# Patient Record
Sex: Female | Born: 1985
Health system: Southern US, Community
[De-identification: ages and names within clinical notes are randomized; demographics above are authoritative.]

## PROBLEM LIST (undated history)

## (undated) DIAGNOSIS — F32A Depression, unspecified: Secondary | ICD-10-CM

## (undated) DIAGNOSIS — F419 Anxiety disorder, unspecified: Secondary | ICD-10-CM

## (undated) DIAGNOSIS — F329 Major depressive disorder, single episode, unspecified: Secondary | ICD-10-CM

## (undated) HISTORY — DX: Anxiety disorder, unspecified: F41.9

## (undated) HISTORY — DX: Depression, unspecified: F32.A

## (undated) HISTORY — PX: BREAST SURGERY: SHX581

## (undated) HISTORY — DX: Major depressive disorder, single episode, unspecified: F32.9

---
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
## 2016-08-21 HISTORY — PX: BREAST ENHANCEMENT SURGERY: SHX7

## 2017-10-30 ENCOUNTER — Telehealth: Payer: Self-pay | Admitting: Nurse Practitioner

## 2017-10-30 DIAGNOSIS — J111 Influenza due to unidentified influenza virus with other respiratory manifestations: Secondary | ICD-10-CM

## 2017-10-30 DIAGNOSIS — R69 Illness, unspecified: Secondary | ICD-10-CM

## 2017-10-30 MED ORDER — OSELTAMIVIR PHOSPHATE 75 MG PO CAPS: 75.0000 mg | ORAL_CAPSULE | Freq: Two times a day (BID) | ORAL | 0 refills | Status: DC

## 2017-10-30 NOTE — Progress Notes (Signed)

## 2017-10-31 MED FILL — OSELTAMIVIR PHOSPHATE 75 MG: 75 | 5 days supply | Qty: 10 | Fill #0

## 2017-11-04 NOTE — Progress Notes (Addendum)
[facility] at [facility], [place], [place] [address] [phone]  Date:  11/05/2017   Name:  Debbie Harris   DOB:  06-11-1986   MRN:  [id number]  PCP:  Darreld Mclean, MD    Chief Complaint: Establish Care (Pt here for est care. )   History of Present Illness:  Debbie Harris is a 32 y.o. very pleasant female patient who presents with the following:  New patient here today to establish care- however she is also still sick from a recent illness She also just had the flu last week, did an evisit for this and got tamiflu She does not feel like she is a lot better.   She has been exposed to illness at her job.  Has not great felt for about a month She did have a fever, up to about 100 degrees She is still coughing a lot and bringing up mucus, dark green in color She continues to have sinus pressure and pain. 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 They plan to start a family soon but she is not yet pregnant  She has a horseshoe kidney and had a ureter repair at age 29 for frequent UTI She does tend to get UTI still, but has learned how to help prevent these  In her free time she enjoys spending time with her dog and cat, cooking, hiking, exercise  Her LMP is current  There are no active problems to display for this patient.   Past Medical History:  Diagnosis Date  . Depression     Past Surgical History:  Procedure Laterality Date  . BREAST ENHANCEMENT SURGERY Bilateral 2018    Social History   Tobacco Use  . Smoking status: Never Smoker  . Smokeless tobacco: Never Used  Substance Use Topics  . Alcohol use: Yes    Comment: social  . Drug use: No     Family History  Problem Relation Age of Onset  . Hypertension Father   . Hyperlipidemia Father   . Breast cancer Maternal Grandmother     Allergies  Allergen Reactions  . Sulfa Antibiotics     Medication list has been reviewed and updated.  Current Outpatient Medications on File Prior to Visit  Medication Sig Dispense Refill  . Multiple Vitamin (MULTIVITAMIN) tablet Take 2 tablets by mouth daily.     No current facility-administered medications on file prior to visit.     Review of Systems:  As per HPI- otherwise negative. No current fever She is not feeling great today but is non- toxic  Physical Examination: Vitals:   11/05/17 1028  BP: 110/82  Pulse: 92  Temp: 98.6 F (37 C)  SpO2: 98%   Vitals:   11/05/17 1028  Weight: 121 lb 12.8 oz (55.2 kg)  Height: 5' 9.75" (1.772 m)   Body mass index is 17.6 kg/m. Ideal Body Weight: Weight in (lb) to have BMI = 25: 172.6  GEN: WDWN, NAD, Non-toxic, A & O x 3, thin build, otherwise looks well  HEENT: Atraumatic, Normocephalic. Neck supple. No masses, No LAD. Ears and Nose: No external deformity. CV:  RRR, No M/G/R. No JVD. No thrill. No extra heart sounds. PULM: CTA B, no wheezes, crackles, rhonchi. No retractions. No resp. distress. No accessory muscle use. ABD: S, NT, ND, +BS. No rebound. No HSM. EXTR: No c/c/e NEURO Normal gait.  PSYCH: Normally interactive. Conversant. Not depressed or anxious appearing.  Calm demeanor.   Dg Chest 2 View  Result Date: 11/05/2017 CLINICAL DATA:  Cough and fever EXAM: CHEST - 2 VIEW COMPARISON:  None. FINDINGS: Lungs are clear. Heart size and pulmonary vascularity are normal. No adenopathy. No bone lesions. IMPRESSION: No edema or consolidation. Electronically Signed   By: Lowella Grip III M.D.   On: 11/05/2017 11:20     Assessment and Plan: Cough - Plan: cefdinir (OMNICEF) 250 MG/5ML suspension, DG Chest 2 View  Low grade fever - Plan: CBC, Comprehensive metabolic  panel, DG Chest 2 View  Screening for hyperlipidemia - Plan: Lipid panel  Screening for diabetes mellitus - Plan: Hemoglobin A1c   Here today to establish care, but also suffering from prolonged sx after recent presumed flu Will obtain CXR today Plan to treat with omnicef for bronchitis/ sinusitis sx Labs pending as well Signed Lamar Blinks, MD Received films, message to pt   Received her labs- message to pt  Results for orders placed or performed in visit on 11/05/17  CBC  Result Value Ref Range   WBC 8.8 4.0 - 10.5 K/uL   RBC 4.67 3.87 - 5.11 Mil/uL   Platelets 316.0 150.0 - 400.0 K/uL   Hemoglobin 14.8 12.0 - 15.0 g/dL   HCT 43.6 36.0 - 46.0 %   MCV 93.4 78.0 - 100.0 fl   MCHC 34.0 30.0 - 36.0 g/dL   RDW 12.6 11.5 - 15.5 %  Comprehensive metabolic panel  Result Value Ref Range   Sodium 140 135 - 145 mEq/L   Potassium 3.8 3.5 - 5.1 mEq/L   Chloride 102 96 - 112 mEq/L   CO2 28 19 - 32 mEq/L   Glucose, Bld 90 70 - 99 mg/dL   BUN 14 6 - 23 mg/dL   Creatinine, Ser 0.80 0.40 - 1.20 mg/dL   Total Bilirubin 0.5 0.2 - 1.2 mg/dL   Alkaline Phosphatase 58 39 - 117 U/L   AST 19 0 - 37 U/L   ALT 20 0 - 35 U/L   Total Protein 8.2 6.0 - 8.3 g/dL   Albumin 4.5 3.5 - 5.2 g/dL   Calcium 10.1 8.4 - 10.5 mg/dL   GFR 87.67 >60.00 mL/min  Lipid panel  Result Value Ref Range   Cholesterol 148 0 - 200 mg/dL   Triglycerides 94.0 0.0 - 149.0 mg/dL   HDL 56.00 >39.00 mg/dL   VLDL 18.8 0.0 - 40.0 mg/dL   LDL Cholesterol 73 0 - 99 mg/dL   Total CHOL/HDL Ratio 3    NonHDL 91.98   Hemoglobin A1c  Result Value Ref Range   Hgb A1c MFr Bld 5.1 4.6 - 6.5 %

## 2017-11-05 ENCOUNTER — Encounter: Payer: Self-pay | Admitting: Family Medicine

## 2017-11-05 ENCOUNTER — Ambulatory Visit (HOSPITAL_BASED_OUTPATIENT_CLINIC_OR_DEPARTMENT_OTHER)
Admission: RE | Admit: 2017-11-05 | Discharge: 2017-11-05 | Disposition: A | Discharge status: Home / Self Care | Payer: 59 | Source: Ambulatory Visit | Attending: Family Medicine | Admitting: Family Medicine

## 2017-11-05 ENCOUNTER — Ambulatory Visit (INDEPENDENT_AMBULATORY_CARE_PROVIDER_SITE_OTHER): Payer: 59 | Admitting: Family Medicine

## 2017-11-05 VITALS — BP 110/82 | HR 92 | Temp 98.6°F | Ht 69.75 in | Wt 121.8 lb

## 2017-11-05 DIAGNOSIS — R05 Cough: Secondary | ICD-10-CM

## 2017-11-05 DIAGNOSIS — Z131 Encounter for screening for diabetes mellitus: Secondary | ICD-10-CM

## 2017-11-05 DIAGNOSIS — Z13220 Encounter for screening for lipoid disorders: Secondary | ICD-10-CM | POA: Diagnosis not present

## 2017-11-05 DIAGNOSIS — R509 Fever, unspecified: Secondary | ICD-10-CM

## 2017-11-05 DIAGNOSIS — R059 Cough, unspecified: Secondary | ICD-10-CM

## 2017-11-05 LAB — COMPREHENSIVE METABOLIC PANEL
ALK PHOS: 58 U/L (ref 39–117)
ALT: 20 U/L (ref 0–35)
AST: 19 U/L (ref 0–37)
Albumin: 4.5 g/dL (ref 3.5–5.2)
BUN: 14 mg/dL (ref 6–23)
CHLORIDE: 102 meq/L (ref 96–112)
CO2: 28 meq/L (ref 19–32)
Calcium: 10.1 mg/dL (ref 8.4–10.5)
Creatinine, Ser: 0.8 mg/dL (ref 0.40–1.20)
GFR: 87.67 mL/min (ref >60.00)
GLUCOSE: 90 mg/dL (ref 70–99)
POTASSIUM: 3.8 meq/L (ref 3.5–5.1)
Sodium: 140 mEq/L (ref 135–145)
Total Bilirubin: 0.5 mg/dL (ref 0.2–1.2)
Total Protein: 8.2 g/dL (ref 6.0–8.3)

## 2017-11-05 LAB — CBC
HEMATOCRIT: 43.6 % (ref 36.0–46.0)
HEMOGLOBIN: 14.8 g/dL (ref 12.0–15.0)
MCHC: 34 g/dL (ref 30.0–36.0)
MCV: 93.4 fl (ref 78.0–100.0)
Platelets: 316 10*3/uL (ref 150.0–400.0)
RBC: 4.67 Mil/uL (ref 3.87–5.11)
RDW: 12.6 % (ref 11.5–15.5)
WBC: 8.8 10*3/uL (ref 4.0–10.5)

## 2017-11-05 LAB — LIPID PANEL
CHOL/HDL RATIO: 3
Cholesterol: 148 mg/dL (ref 0–200)
HDL: 56 mg/dL (ref >39.00)
LDL CALC: 73 mg/dL (ref 0–99)
NONHDL: 91.98
Triglycerides: 94 mg/dL (ref 0.0–149.0)
VLDL: 18.8 mg/dL (ref 0.0–40.0)

## 2017-11-05 LAB — HEMOGLOBIN A1C: HEMOGLOBIN A1C: 5.1 % (ref 4.6–6.5)

## 2017-11-05 MED ORDER — CEFDINIR 250 MG/5ML PO SUSR: ORAL | 0 refills | Status: DC

## 2017-11-05 MED FILL — CEFDINIR 250 MG/5 ML SUSP: 250 | 7 days supply | Qty: 100 | Fill #0

## 2017-11-05 NOTE — Patient Instructions (Signed)
It was nice to meet you today!  I will check some labs for you, and a chest x-ray today We sent an antibiotic to the pharmacy here for you to use for bronchitis/ sinus infection, and will look for any pneumonia on your x-ray today I'll be in touch with your results asap

## 2017-12-22 NOTE — Progress Notes (Signed)
[place] at [facility] [address], [place], [place] [address] [phone]  Date:  12/24/2017   Name:  Debbie Harris   DOB:  06-04-1986   MRN:  [id number]  PCP:  Darreld Mclean, MD    Chief Complaint: Annual Exam (Pt is here for CPE.)   History of Present Illness:  Debbie Harris is a 32 y.o. 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 They plan to start a family soon but she is not yet pregnant She has a horseshoe kidney and had a ureter repair at age 18 for frequent UTI She does tend to get UTI still, but has learned how to help prevent these In her free time she enjoys spending time with her dog and cat, cooking, hiking, exercise  We did labs for her in March which were normal Pap: was done in [place]- she thinks perhaps 2 years ago She did have an abnormal pap and colpo; this was about 3 -4years ago . Her next pap was normal   Started menses today - we discussed doing her pap but she would like to do at a later date  She and her fiance bought a house just yesterday.  The wedding is coming up soon!  She notes that she was getting more anxious and stressed during the time that they were buying their home  She had been off her lexapro, but she decided to go back on.  She went back on this just about 3 weeks ago- this seems to be doing ok.  I will refill for her today Depression is not really an issue for her- more so anxiety She also had some clonopin on hand in the past for occasional use and would like to have some of this for panic attacks- she is not really sure of the dose she was on [place]- no entries, I don't have access to [place] database No SI   She did  have breast implants placed last August- not in [place]- and would like a breast exam today.  She notes that her implants seem low but was told that they would "drop into position" with time    She has noted more difficulty with her computer screen at work recently- she is not sure if she might need an eye exam  There are no active problems to display for this patient.   Past Medical History:  Diagnosis Date  . Depression     Past Surgical History:  Procedure Laterality Date  . BREAST ENHANCEMENT SURGERY Bilateral 2018    Social History   Tobacco Use  . Smoking status: Never Smoker  . Smokeless tobacco: Never Used  Substance Use Topics  . Alcohol use: Yes    Comment: social  . Drug use: No    Family History  Problem Relation Age of Onset  . Hypertension Father   . Hyperlipidemia Father   . Breast cancer Maternal Grandmother     Allergies  Allergen Reactions  . Sulfa Antibiotics     Medication list has been reviewed and updated.  Current Outpatient Medications on File Prior to Visit  Medication Sig  Dispense Refill  . Multiple Vitamin (MULTIVITAMIN) tablet Take 2 tablets by mouth daily.     No current facility-administered medications on file prior to visit.     Review of Systems:  As per HPI- otherwise negative.   Physical Examination: Vitals:   12/24/17 0901  BP: 103/70  Pulse: 69  Temp: 98.3 F (36.8 C)   Vitals:   12/24/17 0901  Weight: 120 lb 9.6 oz (54.7 kg)  Height: 5\' 9"  (1.753 m)   Body mass index is 17.81 kg/m. Ideal Body Weight: Weight in (lb) to have BMI = 25: 168.9  GEN: WDWN, NAD, Non-toxic, A & O x 3, thin build, looks well  HEENT: Atraumatic, Normocephalic. Neck supple. No masses, No LAD. Bilateral TM wnl, oropharynx normal.  PEERL,EOMI.   Limited fundoscopic exam wnl today  Ears and Nose: No external deformity. CV: RRR, No M/G/R. No JVD. No thrill. No extra heart sounds. PULM: CTA B, no wheezes, crackles, rhonchi. No retractions. No  resp. distress. No accessory muscle use. ABD: S, NT, ND, +BS. No rebound. No HSM. EXTR: No c/c/e NEURO Normal gait.  PSYCH: Normally interactive. Conversant. Not depressed or anxious appearing.  Calm demeanor.  Breast exam:  Certainly no masses felt today.   However it does appear to me that her implants are low- I am not sure if this is going to be their final position as I am not a breast surgeon She has several nevi on her back- none appear to be cancerous but one is larger than a pencil eraser    Assessment and Plan: Physical exam  Situational anxiety - Plan: escitalopram (LEXAPRO) 10 MG tablet, clonazePAM (KLONOPIN) 0.5 MG tablet  Multiple pigmented nevi - Plan: Ambulatory referral to Dermatology  CPE today- she will come in for a pap at a later date as she is menstruating today Referral to derm for a skin check Refilled her lexapro rx for clonopin 0.5 for occasional use Plan for a pap later Counseled her that her breasts do not show any masses and medically I don't have any concerns.  However I really can't comment on the placement of her implants.  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

## 2017-12-24 ENCOUNTER — Encounter: Payer: Self-pay | Admitting: Family Medicine

## 2017-12-24 ENCOUNTER — Ambulatory Visit (INDEPENDENT_AMBULATORY_CARE_PROVIDER_SITE_OTHER): Payer: 59 | Admitting: Family Medicine

## 2017-12-24 VITALS — BP 103/70 | HR 69 | Temp 98.3°F | Ht 69.0 in | Wt 120.6 lb

## 2017-12-24 DIAGNOSIS — Z0001 Encounter for general adult medical examination with abnormal findings: Secondary | ICD-10-CM

## 2017-12-24 DIAGNOSIS — D229 Melanocytic nevi, unspecified: Secondary | ICD-10-CM

## 2017-12-24 DIAGNOSIS — F418 Other specified anxiety disorders: Secondary | ICD-10-CM | POA: Diagnosis not present

## 2017-12-24 DIAGNOSIS — Z0000 Encounter for general adult medical examination without abnormal findings: Secondary | ICD-10-CM

## 2017-12-24 MED ORDER — CLONAZEPAM 0.5 MG PO TABS: 0.5000 mg | ORAL_TABLET | Freq: Two times a day (BID) | ORAL | 1 refills | Status: DC | PRN

## 2017-12-24 MED ORDER — ESCITALOPRAM OXALATE 10 MG PO TABS: 10.0000 mg | ORAL_TABLET | Freq: Every day | ORAL | 3 refills | Status: DC

## 2017-12-24 MED FILL — ESCITALOPRAM 10 MG TABLET: 10 | 90 days supply | Qty: 90 | Fill #0

## 2017-12-24 MED FILL — clonazePAM 0.5 MG TABS: 0.5 | 10 days supply | Qty: 20 | Fill #0

## 2017-12-24 NOTE — Patient Instructions (Addendum)
Good to see you today- we can do your pap at your convenience I will set you up to see dermatology for a skin check Please do see an optometrist for an eye exam We refilled your lexapro today I also did give you some klonopin to use on occasion as needed for anxiety - remember this can be habit forming and can make you feel tired. Use this as little as you can   Health Maintenance, Female Adopting a healthy lifestyle and getting preventive care can go a long way to promote health and wellness. Talk with your health care provider about what schedule of regular examinations is right for you. This is a good chance for you to check in with your provider about disease prevention and staying healthy. In between checkups, there are plenty of things you can do on your own. Experts have done a lot of research about which lifestyle changes and preventive measures are most likely to keep you healthy. Ask your health care provider for more information. Weight and diet Eat a healthy diet  Be sure to include plenty of vegetables, fruits, low-fat dairy products, and lean protein.  Do not eat a lot of foods high in solid fats, added sugars, or salt.  Get regular exercise. This is one of the most important things you can do for your health. ? Most adults should exercise for at least 150 minutes each week. The exercise should increase your heart rate and make you sweat (moderate-intensity exercise). ? Most adults should also do strengthening exercises at least twice a week. This is in addition to the moderate-intensity exercise.  Maintain a healthy weight  Body mass index (BMI) is a measurement that can be used to identify possible weight problems. It estimates body fat based on height and weight. Your health care provider can help determine your BMI and help you achieve or maintain a healthy weight.  For females 73 years of age and older: ? A BMI below 18.5 is considered underweight. ? A BMI of 18.5 to 24.9  is normal. ? A BMI of 25 to 29.9 is considered overweight. ? A BMI of 30 and above is considered obese.  Watch levels of cholesterol and blood lipids  You should start having your blood tested for lipids and cholesterol at 34 years of age, then have this test every 5 years.  You may need to have your cholesterol levels checked more often if: ? Your lipid or cholesterol levels are high. ? You are older than 34 years of age. ? You are at high risk for heart disease.  Cancer screening Lung Cancer  Lung cancer screening is recommended for adults 46-14 years old who are at high risk for lung cancer because of a history of smoking.  A yearly low-dose CT scan of the lungs is recommended for people who: ? Currently smoke. ? Have quit within the past 15 years. ? Have at least a 30-pack-year history of smoking. A pack year is smoking an average of one pack of cigarettes a day for 1 year.  Yearly screening should continue until it has been 15 years since you quit.  Yearly screening should stop if you develop a health problem that would prevent you from having lung cancer treatment.  Breast Cancer  Practice breast self-awareness. This means understanding how your breasts normally appear and feel.  It also means doing regular breast self-exams. Let your health care provider know about any changes, no matter how small.  If you  are in your 20s or 30s, you should have a clinical breast exam (CBE) by a health care provider every 1-3 years as part of a regular health exam.  If you are 35 or older, have a CBE every year. Also consider having a breast X-ray (mammogram) every year.  If you have a family history of breast cancer, talk to your health care provider about genetic screening.  If you are at high risk for breast cancer, talk to your health care provider about having an MRI and a mammogram every year.  Breast cancer gene (BRCA) assessment is recommended for women who have family members  with BRCA-related cancers. BRCA-related cancers include: ? Breast. ? Ovarian. ? Tubal. ? Peritoneal cancers.  Results of the assessment will determine the need for genetic counseling and BRCA1 and BRCA2 testing.  Cervical Cancer Your health care provider may recommend that you be screened regularly for cancer of the pelvic organs (ovaries, uterus, and vagina). This screening involves a pelvic examination, including checking for microscopic changes to the surface of your cervix (Pap test). You may be encouraged to have this screening done every 3 years, beginning at age 53.  For women ages 25-65, health care providers may recommend pelvic exams and Pap testing every 3 years, or they may recommend the Pap and pelvic exam, combined with testing for human papilloma virus (HPV), every 5 years. Some types of HPV increase your risk of cervical cancer. Testing for HPV may also be done on women of any age with unclear Pap test results.  Other health care providers may not recommend any screening for nonpregnant women who are considered low risk for pelvic cancer and who do not have symptoms. Ask your health care provider if a screening pelvic exam is right for you.  If you have had past treatment for cervical cancer or a condition that could lead to cancer, you need Pap tests and screening for cancer for at least 20 years after your treatment. If Pap tests have been discontinued, your risk factors (such as having a new sexual partner) need to be reassessed to determine if screening should resume. Some women have medical problems that increase the chance of getting cervical cancer. In these cases, your health care provider may recommend more frequent screening and Pap tests.  Colorectal Cancer  This type of cancer can be detected and often prevented.  Routine colorectal cancer screening usually begins at 34 years of age and continues through 34 years of age.  Your health care provider may recommend  screening at an earlier age if you have risk factors for colon cancer.  Your health care provider may also recommend using home test kits to check for hidden blood in the stool.  A small camera at the end of a tube can be used to examine your colon directly (sigmoidoscopy or colonoscopy). This is done to check for the earliest forms of colorectal cancer.  Routine screening usually begins at age 27.  Direct examination of the colon should be repeated every 5-10 years through 34 years of age. However, you may need to be screened more often if early forms of precancerous polyps or small growths are found.  Skin Cancer  Check your skin from head to toe regularly.  Tell your health care provider about any new moles or changes in moles, especially if there is a change in a mole's shape or color.  Also tell your health care provider if you have a mole that is larger than  the size of a pencil eraser.  Always use sunscreen. Apply sunscreen liberally and repeatedly throughout the day.  Protect yourself by wearing long sleeves, pants, a wide-brimmed hat, and sunglasses whenever you are outside.  Heart disease, diabetes, and high blood pressure  High blood pressure causes heart disease and increases the risk of stroke. High blood pressure is more likely to develop in: ? People who have blood pressure in the high end of the normal range (130-139/85-89 mm Hg). ? People who are overweight or obese. ? People who are African American.  If you are 19-35 years of age, have your blood pressure checked every 3-5 years. If you are 78 years of age or older, have your blood pressure checked every year. You should have your blood pressure measured twice-once when you are at a hospital or clinic, and once when you are not at a hospital or clinic. Record the average of the two measurements. To check your blood pressure when you are not at a hospital or clinic, you can use: ? An automated blood pressure machine at  a pharmacy. ? A home blood pressure monitor.  If you are between 27 years and 57 years old, ask your health care provider if you should take aspirin to prevent strokes.  Have regular diabetes screenings. This involves taking a blood sample to check your fasting blood sugar level. ? If you are at a normal weight and have a low risk for diabetes, have this test once every three years after 34 years of age. ? If you are overweight and have a high risk for diabetes, consider being tested at a younger age or more often. Preventing infection Hepatitis B  If you have a higher risk for hepatitis B, you should be screened for this virus. You are considered at high risk for hepatitis B if: ? You were born in a country where hepatitis B is common. Ask your health care provider which countries are considered high risk. ? Your parents were born in a high-risk country, and you have not been immunized against hepatitis B (hepatitis B vaccine). ? You have HIV or AIDS. ? You use needles to inject street drugs. ? You live with someone who has hepatitis B. ? You have had sex with someone who has hepatitis B. ? You get hemodialysis treatment. ? You take certain medicines for conditions, including cancer, organ transplantation, and autoimmune conditions.  Hepatitis C  Blood testing is recommended for: ? Everyone born from [birth year] through 1967. ? Anyone with known risk factors for hepatitis C.  Sexually transmitted infections (STIs)  You should be screened for sexually transmitted infections (STIs) including gonorrhea and chlamydia if: ? You are sexually active and are younger than 35 years of age. ? You are older than 34 years of age and your health care provider tells you that you are at risk for this type of infection. ? Your sexual activity has changed since you were last screened and you are at an increased risk for chlamydia or gonorrhea. Ask your health care provider if you are at risk.  If you do  not have HIV, but are at risk, it may be recommended that you take a prescription medicine daily to prevent HIV infection. This is called pre-exposure prophylaxis (PrEP). You are considered at risk if: ? You are sexually active and do not regularly use condoms or know the HIV status of your partner(s). ? You take drugs by injection. ? You are sexually active with a  partner who has HIV.  Talk with your health care provider about whether you are at high risk of being infected with HIV. If you choose to begin PrEP, you should first be tested for HIV. You should then be tested every 3 months for as long as you are taking PrEP. Pregnancy  If you are premenopausal and you may become pregnant, ask your health care provider about preconception counseling.  If you may become pregnant, take 400 to 800 micrograms (mcg) of folic acid every day.  If you want to prevent pregnancy, talk to your health care provider about birth control (contraception). Osteoporosis and menopause  Osteoporosis is a disease in which the bones lose minerals and strength with aging. This can result in serious bone fractures. Your risk for osteoporosis can be identified using a bone density scan.  If you are 48 years of age or older, or if you are at risk for osteoporosis and fractures, ask your health care provider if you should be screened.  Ask your health care provider whether you should take a calcium or vitamin D supplement to lower your risk for osteoporosis.  Menopause may have certain physical symptoms and risks.  Hormone replacement therapy may reduce some of these symptoms and risks. Talk to your health care provider about whether hormone replacement therapy is right for you. Follow these instructions at home:  Schedule regular health, dental, and eye exams.  Stay current with your immunizations.  Do not use any tobacco products including cigarettes, chewing tobacco, or electronic cigarettes.  If you are  pregnant, do not drink alcohol.  If you are breastfeeding, limit how much and how often you drink alcohol.  Limit alcohol intake to no more than 1 drink per day for nonpregnant women. One drink equals 12 ounces of beer, 5 ounces of wine, or 1 ounces of hard liquor.  Do not use street drugs.  Do not share needles.  Ask your health care provider for help if you need support or information about quitting drugs.  Tell your health care provider if you often feel depressed.  Tell your health care provider if you have ever been abused or do not feel safe at home. This information is not intended to replace advice given to you by your health care provider. Make sure you discuss any questions you have with your health care provider. Document Released: 02/20/2011 Document Revised: 01/13/2016 Document Reviewed: 05/11/2015 Elsevier Interactive Patient Education  [employer].

## 2018-07-04 MED FILL — ESCITALOPRAM 10 MG TABLET: 10 | 90 days supply | Qty: 90 | Fill #1

## 2018-10-10 ENCOUNTER — Encounter: Payer: 59 | Admitting: Family Medicine

## 2018-11-15 MED FILL — ESCITALOPRAM 10 MG TABLET: 10 | 90 days supply | Qty: 90 | Fill #0

## 2018-12-30 ENCOUNTER — Encounter: Payer: 59 | Admitting: Family Medicine

## 2019-01-28 NOTE — Progress Notes (Addendum)
[facility] [address], [place], [place] [address] [phone]  Date:  01/30/2019   Name:  Debbie Harris   DOB:  09-09-1985   MRN:  [id number]  PCP:  Darreld Mclean, MD    Chief Complaint: Annual Exam (never heard from gyn)   History of Present Illness:  Debbie Harris is a 33 y.o. very pleasant female patient who presents with the following:  Debbie Harris is here today for complete physical. She is a generally healthy young woman, history of anxiety/depression I saw her most recently for a physical about 1 year ago-at that point she had just bought a house, and was about to be married.  She did get married, the wedding was great.   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 History of horseshoe kidney status post ureteral repair at age 20.  History of frequent UTI, although this has improved At last visit she had some concern about breast implants that were placed in 2018.  At that time they appeared low to me, but as I am not a breast surgeon I asked her to direct any questions to the person who did her operation. At her last visit she was using Lexapro, and occasional Klonopin.- she has stopped using lexapro now  She and her husband started trying to conceive just a couple of months ago; she admits to being a bit stressed as she wants to be pregnant as soon as possible  She does not have a GYN as of yet She is taking PNV She has cut down on alcohol and caffeine   Pap: will do for her today  Labs: 10/2017, all normal Tdap: she is not quite sure but feels sure in the last 5 years or so   LMP 6/1 There are no active problems to display for this patient.   Past Medical History:  Diagnosis Date  . Depression     Past Surgical History:  Procedure Laterality Date  . BREAST ENHANCEMENT SURGERY Bilateral 2018    Social History   Tobacco Use  . Smoking status: Never Smoker  . Smokeless  tobacco: Never Used  Substance Use Topics  . Alcohol use: Yes    Comment: social  . Drug use: No    Family History  Problem Relation Age of Onset  . Hypertension Father   . Hyperlipidemia Father   . Breast cancer Maternal Grandmother     Allergies  Allergen Reactions  . Sulfa Antibiotics     Medication list has been reviewed and updated.  Current Outpatient Medications on File Prior to Visit  Medication Sig Dispense Refill  . clonazePAM (KLONOPIN) 0.5 MG tablet Take 1 tablet (0.5 mg total) by mouth 2 (two) times daily as needed for anxiety. 20 tablet 1  . Multiple Vitamin (MULTIVITAMIN) tablet Take 2 tablets by mouth daily.    . Prenatal Vit-Fe Fumarate-FA (PRENATAL MULTIVITAMIN) TABS tablet Take 1 tablet by mouth daily at 12 noon.    . escitalopram (LEXAPRO) 10 MG tablet Take 1 tablet (10 mg total) by mouth daily. (Patient not taking: Reported on 01/30/2019) 90 tablet 3   No current facility-administered medications on file prior to visit.     Review of Systems:  As per HPI- otherwise negative.   Physical Examination: Vitals:   01/30/19 0918  BP: 110/72  Pulse: 84  Resp: 16  Temp: 98.2 F (36.8 C)  SpO2: 97%   Vitals:  01/30/19 0918  Weight: 130 lb (59 kg)  Height: 5\' 9"  (1.753 m)   Body mass index is 19.2 kg/m. Ideal Body Weight: Weight in (lb) to have BMI = 25: 168.9  GEN: WDWN, NAD, Non-toxic, A & O x 3, normal weight, looks well  HEENT: Atraumatic, Normocephalic. Neck supple. No masses, No LAD.  TM wnl bilaterally  Ears and Nose: No external deformity. CV: RRR, No M/G/R. No JVD. No thrill. No extra heart sounds. PULM: CTA B, no wheezes, crackles, rhonchi. No retractions. No resp. distress. No accessory muscle use. ABD: S, NT, ND EXTR: No c/c/e NEURO Normal gait.  PSYCH: Normally interactive. Conversant. Not depressed or anxious appearing.  Calm demeanor.  Breast: s/p implants which have come in to a much better position compared to last year   Pelvic: normal, no vaginal lesions or discharge. Uterus normal, no CMT, no adnexal tendereness or masses     Assessment and Plan: Physical exam  Screening for hyperlipidemia - Plan: Lipid panel  Screening for diabetes mellitus - Plan: Comprehensive metabolic panel, Hemoglobin A1c  Screening for deficiency anemia - Plan: CBC  Screening for cervical cancer - Plan: Cytology - PAP  Here today for a complete physical and pap  Screening labs as above Reassured her that most people do not conceive right away, but if not pregnant in 6 months or trying I would recommend that she seek GYN care as she is nearly 33 yo.   Follow-up:  One year for CPE  Signed Lamar Blinks, MD  Received her pap, message to pt as well  Results for orders placed or performed in visit on 01/30/19  CBC  Result Value Ref Range   WBC 5.3 4.0 - 10.5 K/uL   RBC 4.56 3.87 - 5.11 Mil/uL   Platelets 225.0 150.0 - 400.0 K/uL   Hemoglobin 14.2 12.0 - 15.0 g/dL   HCT 42.5 36.0 - 46.0 %   MCV 93.2 78.0 - 100.0 fl   MCHC 33.4 30.0 - 36.0 g/dL   RDW 12.5 11.5 - 15.5 %  Comprehensive metabolic panel  Result Value Ref Range   Sodium 141 135 - 145 mEq/L   Potassium 3.8 3.5 - 5.1 mEq/L   Chloride 105 96 - 112 mEq/L   CO2 29 19 - 32 mEq/L   Glucose, Bld 78 70 - 99 mg/dL   BUN 14 6 - 23 mg/dL   Creatinine, Ser 0.86 0.40 - 1.20 mg/dL   Total Bilirubin 0.6 0.2 - 1.2 mg/dL   Alkaline Phosphatase 51 39 - 117 U/L   AST 16 0 - 37 U/L   ALT 12 0 - 35 U/L   Total Protein 6.9 6.0 - 8.3 g/dL   Albumin 4.6 3.5 - 5.2 g/dL   Calcium 9.6 8.4 - 10.5 mg/dL   GFR 75.32 >60.00 mL/min  Hemoglobin A1c  Result Value Ref Range   Hgb A1c MFr Bld 5.2 4.6 - 6.5 %  Lipid panel  Result Value Ref Range   Cholesterol 154 0 - 200 mg/dL   Triglycerides 50.0 0.0 - 149.0 mg/dL   HDL 64.70 >39.00 mg/dL   VLDL 10.0 0.0 - 40.0 mg/dL   LDL Cholesterol 80 0 - 99 mg/dL   Total CHOL/HDL Ratio 2    NonHDL 89.57   Cytology - PAP  Result  Value Ref Range   Adequacy      Satisfactory for evaluation  endocervical/transformation zone component PRESENT.   Diagnosis      NEGATIVE FOR INTRAEPITHELIAL  LESIONS OR MALIGNANCY.   Material Submitted CervicoVaginal Pap [ThinPrep Imaged]

## 2019-01-30 ENCOUNTER — Ambulatory Visit (INDEPENDENT_AMBULATORY_CARE_PROVIDER_SITE_OTHER): Payer: No Typology Code available for payment source | Admitting: Family Medicine

## 2019-01-30 ENCOUNTER — Other Ambulatory Visit (HOSPITAL_COMMUNITY)
Admission: RE | Admit: 2019-01-30 | Discharge: 2019-01-30 | Disposition: A | Discharge status: Home / Self Care | Payer: No Typology Code available for payment source | Source: Ambulatory Visit | Attending: Family Medicine | Admitting: Family Medicine

## 2019-01-30 ENCOUNTER — Other Ambulatory Visit: Payer: Self-pay

## 2019-01-30 ENCOUNTER — Encounter: Payer: Self-pay | Admitting: Family Medicine

## 2019-01-30 VITALS — BP 110/72 | HR 84 | Temp 98.2°F | Resp 16 | Ht 69.0 in | Wt 130.0 lb

## 2019-01-30 DIAGNOSIS — Z124 Encounter for screening for malignant neoplasm of cervix: Secondary | ICD-10-CM | POA: Diagnosis not present

## 2019-01-30 DIAGNOSIS — Z130 Encounter for screening for diseases of the blood and blood-forming organs and certain disorders involving the immune mechanism: Secondary | ICD-10-CM

## 2019-01-30 DIAGNOSIS — Z13220 Encounter for screening for lipoid disorders: Secondary | ICD-10-CM | POA: Diagnosis not present

## 2019-01-30 DIAGNOSIS — Z0000 Encounter for general adult medical examination without abnormal findings: Secondary | ICD-10-CM

## 2019-01-30 DIAGNOSIS — Z131 Encounter for screening for diabetes mellitus: Secondary | ICD-10-CM | POA: Diagnosis not present

## 2019-01-30 LAB — LIPID PANEL
Cholesterol: 154 mg/dL (ref 0–200)
HDL: 64.7 mg/dL (ref >39.00)
LDL Cholesterol: 80 mg/dL (ref 0–99)
NonHDL: 89.57
Total CHOL/HDL Ratio: 2
Triglycerides: 50 mg/dL (ref 0.0–149.0)
VLDL: 10 mg/dL (ref 0.0–40.0)

## 2019-01-30 LAB — COMPREHENSIVE METABOLIC PANEL
ALT: 12 U/L (ref 0–35)
AST: 16 U/L (ref 0–37)
Albumin: 4.6 g/dL (ref 3.5–5.2)
Alkaline Phosphatase: 51 U/L (ref 39–117)
BUN: 14 mg/dL (ref 6–23)
CO2: 29 mEq/L (ref 19–32)
Calcium: 9.6 mg/dL (ref 8.4–10.5)
Chloride: 105 mEq/L (ref 96–112)
Creatinine, Ser: 0.86 mg/dL (ref 0.40–1.20)
GFR: 75.32 mL/min (ref >60.00)
Glucose, Bld: 78 mg/dL (ref 70–99)
Potassium: 3.8 mEq/L (ref 3.5–5.1)
Sodium: 141 mEq/L (ref 135–145)
Total Bilirubin: 0.6 mg/dL (ref 0.2–1.2)
Total Protein: 6.9 g/dL (ref 6.0–8.3)

## 2019-01-30 LAB — HEMOGLOBIN A1C: Hgb A1c MFr Bld: 5.2 % (ref 4.6–6.5)

## 2019-01-30 LAB — CBC
HCT: 42.5 % (ref 36.0–46.0)
Hemoglobin: 14.2 g/dL (ref 12.0–15.0)
MCHC: 33.4 g/dL (ref 30.0–36.0)
MCV: 93.2 fl (ref 78.0–100.0)
Platelets: 225 10*3/uL (ref 150.0–400.0)
RBC: 4.56 Mil/uL (ref 3.87–5.11)
RDW: 12.5 % (ref 11.5–15.5)
WBC: 5.3 10*3/uL (ref 4.0–10.5)

## 2019-01-30 NOTE — Patient Instructions (Addendum)
Great to see you today- take care and I will be in touch with your labs and pap asap Best of luck with becoming pregnant!  If you don't have success in about 6 months please consult with the GYN of your choice for further evaluation  I am happy to go ahead and refer you for a new patient appt so that you are established when the time comes- just let me know who you might like to see   Health Maintenance, Female Adopting a healthy lifestyle and getting preventive care can go a long way to promote health and wellness. Talk with your health care provider about what schedule of regular examinations is right for you. This is a good chance for you to check in with your provider about disease prevention and staying healthy. In between checkups, there are plenty of things you can do on your own. Experts have done a lot of research about which lifestyle changes and preventive measures are most likely to keep you healthy. Ask your health care provider for more information. Weight and diet Eat a healthy diet  Be sure to include plenty of vegetables, fruits, low-fat dairy products, and lean protein.  Do not eat a lot of foods high in solid fats, added sugars, or salt.  Get regular exercise. This is one of the most important things you can do for your health. ? Most adults should exercise for at least 150 minutes each week. The exercise should increase your heart rate and make you sweat (moderate-intensity exercise). ? Most adults should also do strengthening exercises at least twice a week. This is in addition to the moderate-intensity exercise. Maintain a healthy weight  Body mass index (BMI) is a measurement that can be used to identify possible weight problems. It estimates body fat based on height and weight. Your health care provider can help determine your BMI and help you achieve or maintain a healthy weight.  For females 28 years of age and older: ? A BMI below 18.5 is considered underweight. ? A  BMI of 18.5 to 24.9 is normal. ? A BMI of 25 to 29.9 is considered overweight. ? A BMI of 30 and above is considered obese. Watch levels of cholesterol and blood lipids  You should start having your blood tested for lipids and cholesterol at 35 years of age, then have this test every 5 years.  You may need to have your cholesterol levels checked more often if: ? Your lipid or cholesterol levels are high. ? You are older than 35 years of age. ? You are at high risk for heart disease. Cancer screening Lung Cancer  Lung cancer screening is recommended for adults 49-58 years old who are at high risk for lung cancer because of a history of smoking.  A yearly low-dose CT scan of the lungs is recommended for people who: ? Currently smoke. ? Have quit within the past 15 years. ? Have at least a 30-pack-year history of smoking. A pack year is smoking an average of one pack of cigarettes a day for 1 year.  Yearly screening should continue until it has been 15 years since you quit.  Yearly screening should stop if you develop a health problem that would prevent you from having lung cancer treatment. Breast Cancer  Practice breast self-awareness. This means understanding how your breasts normally appear and feel.  It also means doing regular breast self-exams. Let your health care provider know about any changes, no matter how small.  If you are in your 20s or 30s, you should have a clinical breast exam (CBE) by a health care provider every 1-3 years as part of a regular health exam.  If you are 35 or older, have a CBE every year. Also consider having a breast X-ray (mammogram) every year.  If you have a family history of breast cancer, talk to your health care provider about genetic screening.  If you are at high risk for breast cancer, talk to your health care provider about having an MRI and a mammogram every year.  Breast cancer gene (BRCA) assessment is recommended for women who have  family members with BRCA-related cancers. BRCA-related cancers include: ? Breast. ? Ovarian. ? Tubal. ? Peritoneal cancers.  Results of the assessment will determine the need for genetic counseling and BRCA1 and BRCA2 testing. Cervical Cancer Your health care provider may recommend that you be screened regularly for cancer of the pelvic organs (ovaries, uterus, and vagina). This screening involves a pelvic examination, including checking for microscopic changes to the surface of your cervix (Pap test). You may be encouraged to have this screening done every 3 years, beginning at age 48.  For women ages 10-65, health care providers may recommend pelvic exams and Pap testing every 3 years, or they may recommend the Pap and pelvic exam, combined with testing for human papilloma virus (HPV), every 5 years. Some types of HPV increase your risk of cervical cancer. Testing for HPV may also be done on women of any age with unclear Pap test results.  Other health care providers may not recommend any screening for nonpregnant women who are considered low risk for pelvic cancer and who do not have symptoms. Ask your health care provider if a screening pelvic exam is right for you.  If you have had past treatment for cervical cancer or a condition that could lead to cancer, you need Pap tests and screening for cancer for at least 20 years after your treatment. If Pap tests have been discontinued, your risk factors (such as having a new sexual partner) need to be reassessed to determine if screening should resume. Some women have medical problems that increase the chance of getting cervical cancer. In these cases, your health care provider may recommend more frequent screening and Pap tests. Colorectal Cancer  This type of cancer can be detected and often prevented.  Routine colorectal cancer screening usually begins at 35 years of age and continues through 35 years of age.  Your health care provider may  recommend screening at an earlier age if you have risk factors for colon cancer.  Your health care provider may also recommend using home test kits to check for hidden blood in the stool.  A small camera at the end of a tube can be used to examine your colon directly (sigmoidoscopy or colonoscopy). This is done to check for the earliest forms of colorectal cancer.  Routine screening usually begins at age 30.  Direct examination of the colon should be repeated every 5-10 years through 35 years of age. However, you may need to be screened more often if early forms of precancerous polyps or small growths are found. Skin Cancer  Check your skin from head to toe regularly.  Tell your health care provider about any new moles or changes in moles, especially if there is a change in a mole's shape or color.  Also tell your health care provider if you have a mole that is larger than the  size of a pencil eraser.  Always use sunscreen. Apply sunscreen liberally and repeatedly throughout the day.  Protect yourself by wearing long sleeves, pants, a wide-brimmed hat, and sunglasses whenever you are outside. Heart disease, diabetes, and high blood pressure  High blood pressure causes heart disease and increases the risk of stroke. High blood pressure is more likely to develop in: ? People who have blood pressure in the high end of the normal range (130-139/85-89 mm Hg). ? People who are overweight or obese. ? People who are African American.  If you are 30-69 years of age, have your blood pressure checked every 3-5 years. If you are 70 years of age or older, have your blood pressure checked every year. You should have your blood pressure measured twice-once when you are at a hospital or clinic, and once when you are not at a hospital or clinic. Record the average of the two measurements. To check your blood pressure when you are not at a hospital or clinic, you can use: ? An automated blood pressure  machine at a pharmacy. ? A home blood pressure monitor.  If you are between 19 years and 87 years old, ask your health care provider if you should take aspirin to prevent strokes.  Have regular diabetes screenings. This involves taking a blood sample to check your fasting blood sugar level. ? If you are at a normal weight and have a low risk for diabetes, have this test once every three years after 35 years of age. ? If you are overweight and have a high risk for diabetes, consider being tested at a younger age or more often. Preventing infection Hepatitis B  If you have a higher risk for hepatitis B, you should be screened for this virus. You are considered at high risk for hepatitis B if: ? You were born in a country where hepatitis B is common. Ask your health care provider which countries are considered high risk. ? Your parents were born in a high-risk country, and you have not been immunized against hepatitis B (hepatitis B vaccine). ? You have HIV or AIDS. ? You use needles to inject street drugs. ? You live with someone who has hepatitis B. ? You have had sex with someone who has hepatitis B. ? You get hemodialysis treatment. ? You take certain medicines for conditions, including cancer, organ transplantation, and autoimmune conditions. Hepatitis C  Blood testing is recommended for: ? Everyone born from [birth year] through 1967. ? Anyone with known risk factors for hepatitis C. Sexually transmitted infections (STIs)  You should be screened for sexually transmitted infections (STIs) including gonorrhea and chlamydia if: ? You are sexually active and are younger than 35 years of age. ? You are older than 35 years of age and your health care provider tells you that you are at risk for this type of infection. ? Your sexual activity has changed since you were last screened and you are at an increased risk for chlamydia or gonorrhea. Ask your health care provider if you are at risk.  If  you do not have HIV, but are at risk, it may be recommended that you take a prescription medicine daily to prevent HIV infection. This is called pre-exposure prophylaxis (PrEP). You are considered at risk if: ? You are sexually active and do not regularly use condoms or know the HIV status of your partner(s). ? You take drugs by injection. ? You are sexually active with a partner who has HIV.  Talk with your health care provider about whether you are at high risk of being infected with HIV. If you choose to begin PrEP, you should first be tested for HIV. You should then be tested every 3 months for as long as you are taking PrEP. Pregnancy  If you are premenopausal and you may become pregnant, ask your health care provider about preconception counseling.  If you may become pregnant, take 400 to 800 micrograms (mcg) of folic acid every day.  If you want to prevent pregnancy, talk to your health care provider about birth control (contraception). Osteoporosis and menopause  Osteoporosis is a disease in which the bones lose minerals and strength with aging. This can result in serious bone fractures. Your risk for osteoporosis can be identified using a bone density scan.  If you are 70 years of age or older, or if you are at risk for osteoporosis and fractures, ask your health care provider if you should be screened.  Ask your health care provider whether you should take a calcium or vitamin D supplement to lower your risk for osteoporosis.  Menopause may have certain physical symptoms and risks.  Hormone replacement therapy may reduce some of these symptoms and risks. Talk to your health care provider about whether hormone replacement therapy is right for you. Follow these instructions at home:  Schedule regular health, dental, and eye exams.  Stay current with your immunizations.  Do not use any tobacco products including cigarettes, chewing tobacco, or electronic cigarettes.  If you are  pregnant, do not drink alcohol.  If you are breastfeeding, limit how much and how often you drink alcohol.  Limit alcohol intake to no more than 1 drink per day for nonpregnant women. One drink equals 12 ounces of beer, 5 ounces of wine, or 1 ounces of hard liquor.  Do not use street drugs.  Do not share needles.  Ask your health care provider for help if you need support or information about quitting drugs.  Tell your health care provider if you often feel depressed.  Tell your health care provider if you have ever been abused or do not feel safe at home. This information is not intended to replace advice given to you by your health care provider. Make sure you discuss any questions you have with your health care provider. Document Released: 02/20/2011 Document Revised: 01/13/2016 Document Reviewed: 05/11/2015 Elsevier Interactive Patient Education  2019 [employer].

## 2019-02-03 LAB — CYTOLOGY - PAP: Diagnosis: NEGATIVE

## 2019-02-15 IMAGING — CR DG CHEST 2V
2 series · 2 of 2 positions shown · non-contrast
Comparison: None.

CLINICAL DATA: Cough and fever

EXAM:
CHEST - 2 VIEW

[w chest pa]
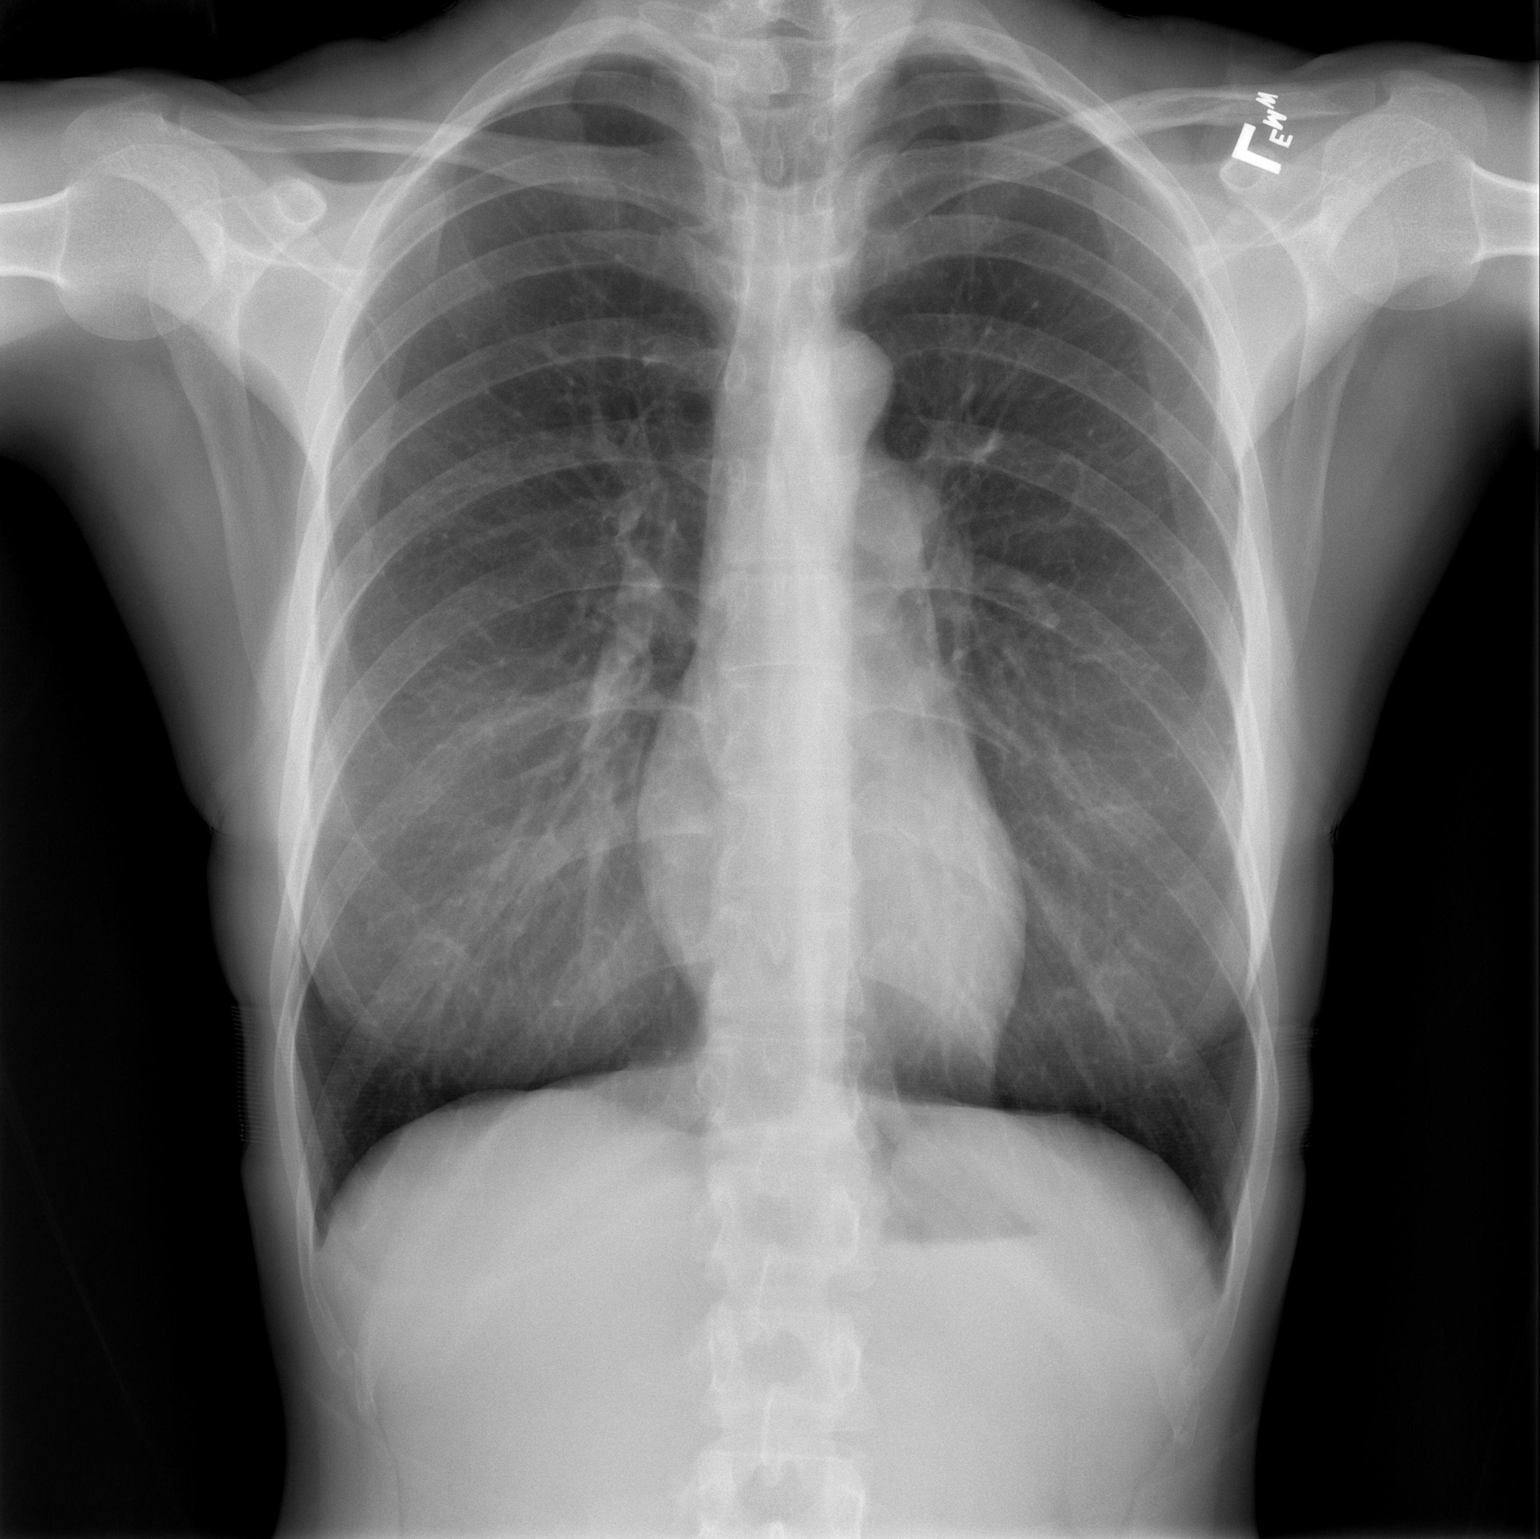

[w chest lat]
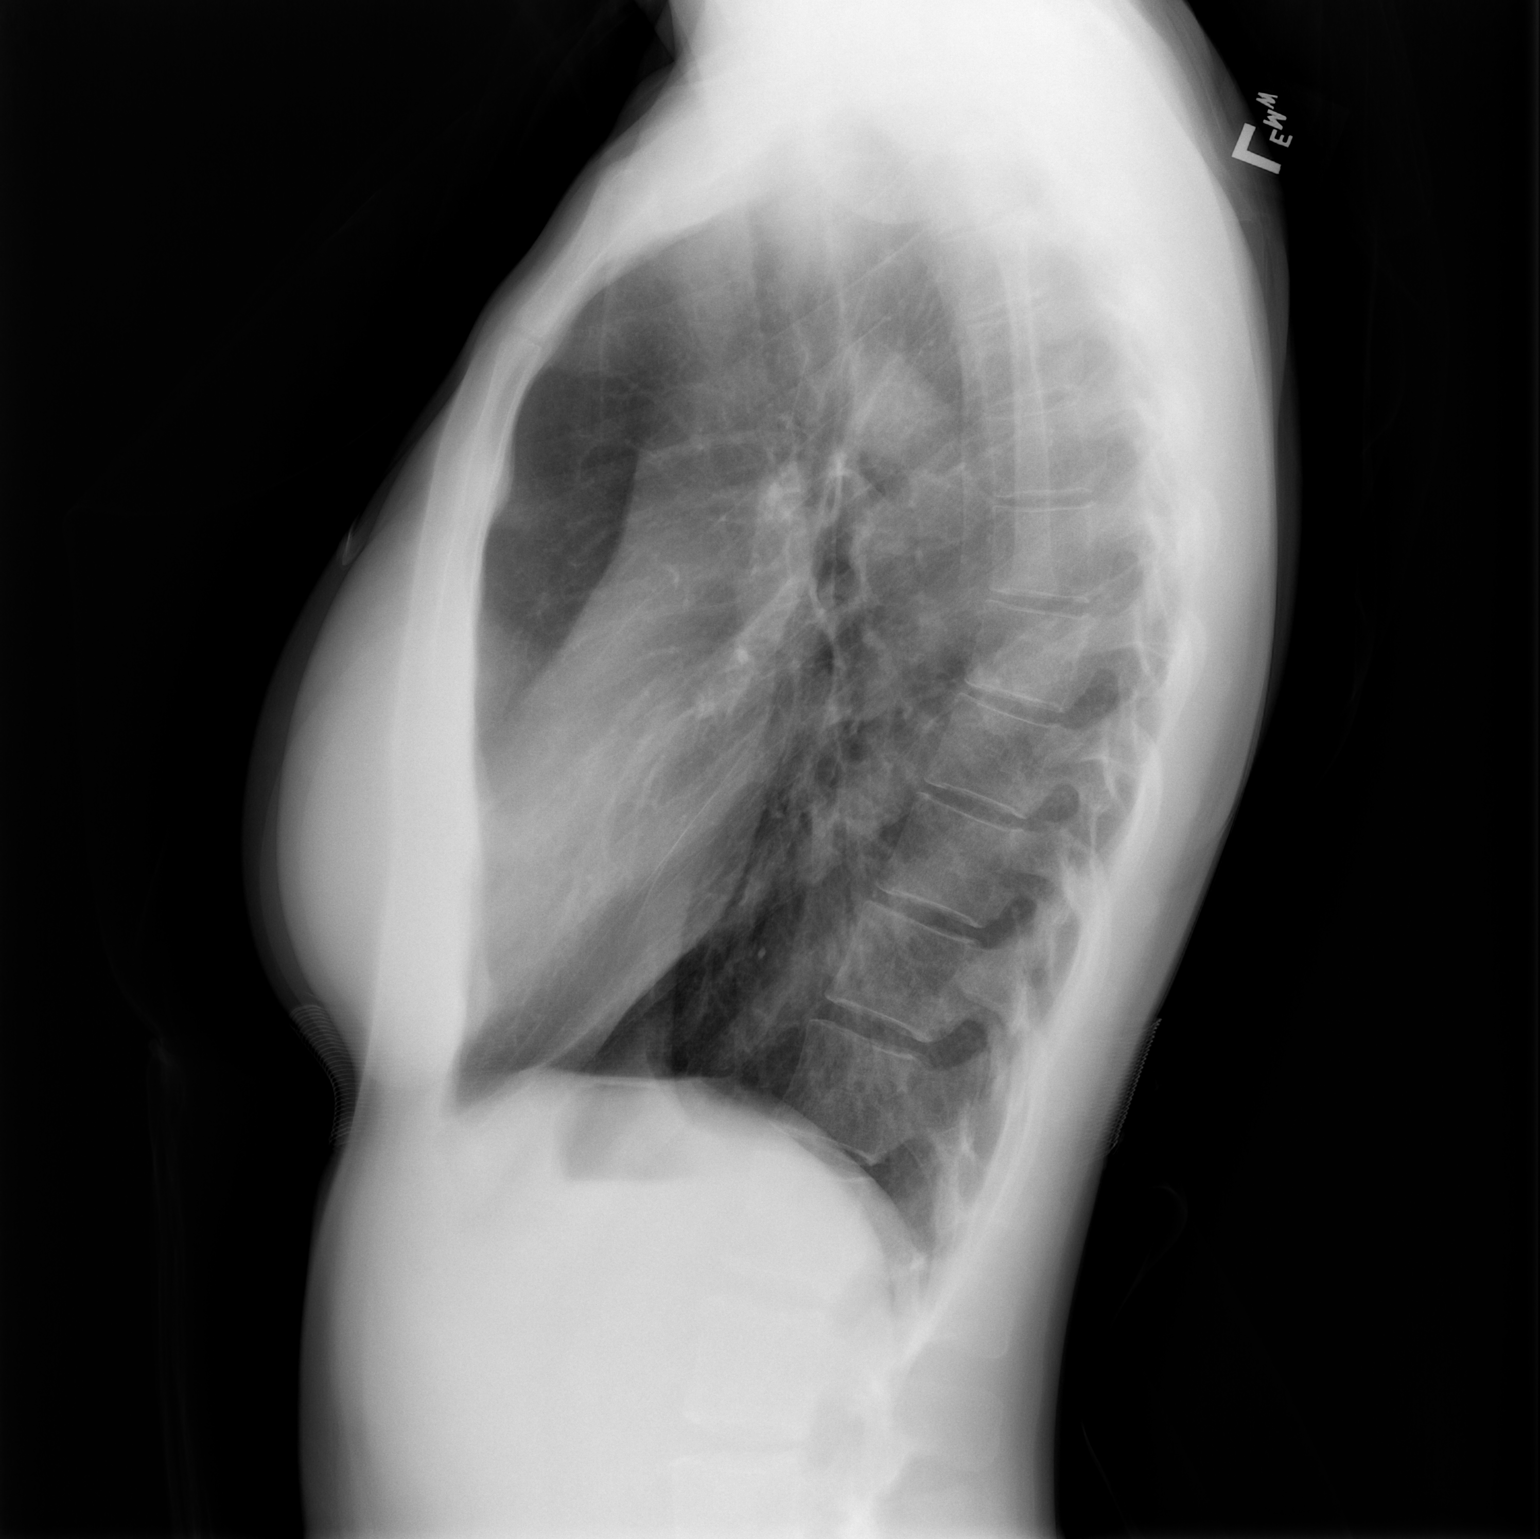

[2 of 2 positions shown; findings below may reference images not displayed]

FINDINGS: Lungs are clear. Heart size and pulmonary vascularity are normal. No
adenopathy. No bone lesions.
IMPRESSION: No edema or consolidation.

## 2019-03-10 ENCOUNTER — Encounter: Payer: Self-pay | Admitting: Family Medicine

## 2019-03-10 DIAGNOSIS — Z3169 Encounter for other general counseling and advice on procreation: Secondary | ICD-10-CM

## 2019-06-16 ENCOUNTER — Encounter: Payer: Self-pay | Admitting: Family Medicine

## 2019-07-28 ENCOUNTER — Encounter: Payer: Self-pay | Admitting: Family Medicine

## 2019-07-28 DIAGNOSIS — F418 Other specified anxiety disorders: Secondary | ICD-10-CM

## 2019-07-28 MED ORDER — ESCITALOPRAM OXALATE 10 MG PO TABS: 10.0000 mg | ORAL_TABLET | Freq: Every day | ORAL | 3 refills | Status: DC

## 2019-07-28 MED FILL — ESCITALOPRAM 10 MG TABLET: 10 | 90 days supply | Qty: 90 | Fill #0

## 2019-12-04 MED FILL — ESCITALOPRAM 10 MG TABLET: 10 | 90 days supply | Qty: 90 | Fill #1

## 2019-12-10 DIAGNOSIS — Z319 Encounter for procreative management, unspecified: Secondary | ICD-10-CM | POA: Diagnosis not present

## 2019-12-10 DIAGNOSIS — N946 Dysmenorrhea, unspecified: Secondary | ICD-10-CM | POA: Diagnosis not present

## 2019-12-10 DIAGNOSIS — E288 Other ovarian dysfunction: Secondary | ICD-10-CM | POA: Diagnosis not present

## 2019-12-24 MED FILL — OVIDREL 250 MCG/0.5 ML SYRG: 250 | 1 days supply | Qty: 1 | Fill #0

## 2019-12-24 MED FILL — LETROZOLE 2.5 MG TABS: 2.5 | 5 days supply | Qty: 15 | Fill #0

## 2020-01-08 DIAGNOSIS — Z3141 Encounter for fertility testing: Secondary | ICD-10-CM | POA: Diagnosis not present

## 2020-01-08 DIAGNOSIS — Z3189 Encounter for other procreative management: Secondary | ICD-10-CM | POA: Diagnosis not present

## 2020-01-13 DIAGNOSIS — Z3189 Encounter for other procreative management: Secondary | ICD-10-CM | POA: Diagnosis not present

## 2020-01-15 DIAGNOSIS — E288 Other ovarian dysfunction: Secondary | ICD-10-CM | POA: Diagnosis not present

## 2020-01-31 NOTE — Patient Instructions (Addendum)
It was great to see you again today Best of luck with your fertility journey, please keep [place] posted.   I would suggest trying elimination of dairy or using only lactaid type products for 4-5 days; if this helps your gassiness is likely due to lactose intolerance.  If not dairy, you might try elimination of gluten    Health Maintenance, Female Adopting a healthy lifestyle and getting preventive care are important in promoting health and wellness. Ask your health care provider about:  The right schedule for you to have regular tests and exams.  Things you can do on your own to prevent diseases and keep yourself healthy. What should I know about diet, weight, and exercise? Eat a healthy diet   Eat a diet that includes plenty of vegetables, fruits, low-fat dairy products, and lean protein.  Do not eat a lot of foods that are high in solid fats, added sugars, or sodium. Maintain a healthy weight Body mass index (BMI) is used to identify weight problems. It estimates body fat based on height and weight. Your health care provider can help determine your BMI and help you achieve or maintain a healthy weight. Get regular exercise Get regular exercise. This is one of the most important things you can do for your health. Most adults should:  Exercise for at least 150 minutes each week. The exercise should increase your heart rate and make you sweat (moderate-intensity exercise).  Do strengthening exercises at least twice a week. This is in addition to the moderate-intensity exercise.  Spend less time sitting. Even light physical activity can be beneficial. Watch cholesterol and blood lipids Have your blood tested for lipids and cholesterol at 36 years of age, then have this test every 5 years. Have your cholesterol levels checked more often if:  Your lipid or cholesterol levels are high.  You are older than 36 years of age.  You are at high risk for heart disease. What should I know about  cancer screening? Depending on your health history and family history, you may need to have cancer screening at various ages. This may include screening for:  Breast cancer.  Cervical cancer.  Colorectal cancer.  Skin cancer.  Lung cancer. What should I know about heart disease, diabetes, and high blood pressure? Blood pressure and heart disease  High blood pressure causes heart disease and increases the risk of stroke. This is more likely to develop in people who have high blood pressure readings, are of African descent, or are overweight.  Have your blood pressure checked: ? Every 3-5 years if you are 66-14 years of age. ? Every year if you are 58 years old or older. Diabetes Have regular diabetes screenings. This checks your fasting blood sugar level. Have the screening done:  Once every three years after age 50 if you are at a normal weight and have a low risk for diabetes.  More often and at a younger age if you are overweight or have a high risk for diabetes. What should I know about preventing infection? Hepatitis B If you have a higher risk for hepatitis B, you should be screened for this virus. Talk with your health care provider to find out if you are at risk for hepatitis B infection. Hepatitis C Testing is recommended for:  Everyone born from [birth year] through 1967.  Anyone with known risk factors for hepatitis C. Sexually transmitted infections (STIs)  Get screened for STIs, including gonorrhea and chlamydia, if: ? You are sexually active  and are younger than 36 years of age. ? You are older than 36 years of age and your health care provider tells you that you are at risk for this type of infection. ? Your sexual activity has changed since you were last screened, and you are at increased risk for chlamydia or gonorrhea. Ask your health care provider if you are at risk.  Ask your health care provider about whether you are at high risk for HIV. Your health care  provider may recommend a prescription medicine to help prevent HIV infection. If you choose to take medicine to prevent HIV, you should first get tested for HIV. You should then be tested every 3 months for as long as you are taking the medicine. Pregnancy  If you are about to stop having your period (premenopausal) and you may become pregnant, seek counseling before you get pregnant.  Take 400 to 800 micrograms (mcg) of folic acid every day if you become pregnant.  Ask for birth control (contraception) if you want to prevent pregnancy. Osteoporosis and menopause Osteoporosis is a disease in which the bones lose minerals and strength with aging. This can result in bone fractures. If you are 51 years old or older, or if you are at risk for osteoporosis and fractures, ask your health care provider if you should:  Be screened for bone loss.  Take a calcium or vitamin D supplement to lower your risk of fractures.  Be given hormone replacement therapy (HRT) to treat symptoms of menopause. Follow these instructions at home: Lifestyle  Do not use any products that contain nicotine or tobacco, such as cigarettes, e-cigarettes, and chewing tobacco. If you need help quitting, ask your health care provider.  Do not use street drugs.  Do not share needles.  Ask your health care provider for help if you need support or information about quitting drugs. Alcohol use  Do not drink alcohol if: ? Your health care provider tells you not to drink. ? You are pregnant, may be pregnant, or are planning to become pregnant.  If you drink alcohol: ? Limit how much you use to 0-1 drink a day. ? Limit intake if you are breastfeeding.  Be aware of how much alcohol is in your drink. In the [place] one drink equals one 12 oz bottle of beer (355 mL), one 5 oz glass of wine (148 mL), or one 1 oz glass of hard liquor (44 mL). General instructions  Schedule regular health, dental, and eye exams.  Stay current  with your vaccines.  Tell your health care provider if: ? You often feel depressed. ? You have ever been abused or do not feel safe at home. Summary  Adopting a healthy lifestyle and getting preventive care are important in promoting health and wellness.  Follow your health care provider's instructions about healthy diet, exercising, and getting tested or screened for diseases.  Follow your health care provider's instructions on monitoring your cholesterol and blood pressure. This information is not intended to replace advice given to you by your health care provider. Make sure you discuss any questions you have with your health care provider. Document Revised: 07/31/2018 Document Reviewed: 07/31/2018 Elsevier Patient Education  2020 [employer].

## 2020-01-31 NOTE — Progress Notes (Signed)
[facility] [address] [place], [place] [address] [phone]  Date:  02/02/2020   Name:  Debbie Harris   DOB:  1985/08/26   MRN:  [id number]  PCP:  Darreld Mclean, MD    Chief Complaint: Annual Exam (sees gyn)   History of Present Illness:  Debbie Harris is a 34 y.o. very pleasant female patient who presents with the following:  Generally healthy young woman here today for physical exam  Last seen by myself 1 year ago for physical exam  Tetanus vaccine; she is UTD per employee health  Pap up-to-date Most recent lab work about 1 year ago She had her covid series and will get [place] her dates - forgot at home   She works as a Psychologist, counselling with cardiology.  She is working here at [employer] a couple of times a month  History of horseshoe kidney status post ureter repair age 9 At her last visit she was hoping to conceive, she had married about a year previously Unfortunately Debbie Harris has not been able to become pregnant just yet.  She is working with a Media planner and we hope that she will soon have success  She has done one round of IUI, and is on femara right now They will think about doing IVF next cycle  She feels like she gets bloated and gassy recently esp after eating - we discussed trying a lactose elimination diet for a few days.  If this does not help, she might try avoiding gluten for a few days She is on lexapro and doing ok with this right now  Debbie Harris has had a lot of blood work recently, she would prefer to defer labs today   There are no problems to display for this patient.   Past Medical History:  Diagnosis Date   Depression     Past Surgical History:  Procedure Laterality Date   BREAST ENHANCEMENT SURGERY Bilateral 2018    Social History   Tobacco Use   Smoking status: Never Smoker   Smokeless tobacco: Never Used  Substance Use Topics   Alcohol use:  Yes    Comment: social   Drug use: No    Family History  Problem Relation Age of Onset   Hypertension Father    Hyperlipidemia Father    Breast cancer Maternal Grandmother     Allergies  Allergen Reactions   Sulfa Antibiotics     Medication list has been reviewed and updated.  Current Outpatient Medications on File Prior to Visit  Medication Sig Dispense Refill   Cholecalciferol (VITAMIN D) 125 MCG (5000 UT) CAPS Take by mouth.     Coenzyme Q10 (COQ-10 PO) Take 200 mg by mouth daily.     escitalopram (LEXAPRO) 10 MG tablet Take 1 tablet (10 mg total) by mouth daily. 90 tablet 3   letrozole (FEMARA) 2.5 MG tablet TAKE 3 TABLETS BY MOUTH ONCE A DAY ON CYCLE DAYS 3 THROUGH 7 OF YOUR MENSTRUAL CYCLE     Multiple Vitamin (MULTIVITAMIN) tablet Take 2 tablets by mouth daily.     OVIDREL 250 MCG/0.5ML injection Inject into the skin daily.     Prenatal Vit-Fe Fumarate-FA (PRENATAL MULTIVITAMIN) TABS tablet Take 1 tablet by mouth daily at 12 noon.     No current facility-administered medications on file prior to visit.    Review of Systems:  As per HPI- otherwise negative.   Physical Examination: Vitals:  02/02/20 0952  BP: 102/70  Pulse: 79  Resp: 16  Temp: 98 F (36.7 C)  SpO2: 98%   Vitals:   02/02/20 0952  Weight: 127 lb (57.6 kg)  Height: 5\' 9"  (1.753 m)   Body mass index is 18.75 kg/m. Ideal Body Weight: Weight in (lb) to have BMI = 25: 168.9  GEN: no acute distress.  Slim build, looks well HEENT: Atraumatic, Normocephalic.   Bilateral TM wnl, oropharynx normal.  PEERL,EOMI.   Ears and Nose: No external deformity. CV: RRR, No M/G/R. No JVD. No thrill. No extra heart sounds. PULM: CTA B, no wheezes, crackles, rhonchi. No retractions. No resp. distress. No accessory muscle use. ABD: S, NT, ND, +BS. No rebound. No HSM. EXTR: No c/c/e PSYCH: Normally interactive. Conversant.   Wt Readings from Last 3 Encounters:  02/02/20 127 lb (57.6 kg)   01/30/19 130 lb (59 kg)  12/24/17 120 lb 9.6 oz (54.7 kg)      Assessment and Plan: Physical exam  Situational anxiety  Here today for routine physical, we discussed health maintenance which is up-to-date She does not smoke or drink to excess, gets regular exercise She is currently working with a fertility specialist and is hoping to conceive soon.  She is using Lexapro right now for anxiety-I asked her to get in touch with me if she needs any further assistance in the months to come Defer any lab work today as she has had quite a bit of blood drawn recently She will get in touch with [place] with the days of her Covid vaccines, she does not have this handy at the moment This visit occurred during the SARS-CoV-2 public health emergency.  Safety protocols were in place, including screening questions prior to the visit, additional usage of staff PPE, and extensive cleaning of exam room while observing appropriate contact time as indicated for disinfecting solutions.    Signed Lamar Blinks, MD

## 2020-02-02 ENCOUNTER — Ambulatory Visit (INDEPENDENT_AMBULATORY_CARE_PROVIDER_SITE_OTHER): Payer: 59 | Admitting: Family Medicine

## 2020-02-02 ENCOUNTER — Other Ambulatory Visit: Payer: Self-pay

## 2020-02-02 ENCOUNTER — Encounter: Payer: Self-pay | Admitting: Family Medicine

## 2020-02-02 VITALS — BP 102/70 | HR 79 | Temp 98.0°F | Resp 16 | Ht 69.0 in | Wt 127.0 lb

## 2020-02-02 DIAGNOSIS — Z0000 Encounter for general adult medical examination without abnormal findings: Secondary | ICD-10-CM

## 2020-02-02 DIAGNOSIS — F418 Other specified anxiety disorders: Secondary | ICD-10-CM | POA: Diagnosis not present

## 2020-02-04 DIAGNOSIS — Z319 Encounter for procreative management, unspecified: Secondary | ICD-10-CM | POA: Diagnosis not present

## 2020-03-01 DIAGNOSIS — Z113 Encounter for screening for infections with a predominantly sexual mode of transmission: Secondary | ICD-10-CM | POA: Diagnosis not present

## 2020-03-01 DIAGNOSIS — E288 Other ovarian dysfunction: Secondary | ICD-10-CM | POA: Diagnosis not present

## 2020-03-01 DIAGNOSIS — Z3141 Encounter for fertility testing: Secondary | ICD-10-CM | POA: Diagnosis not present

## 2020-03-01 DIAGNOSIS — E2839 Other primary ovarian failure: Secondary | ICD-10-CM | POA: Diagnosis not present

## 2020-03-01 DIAGNOSIS — N946 Dysmenorrhea, unspecified: Secondary | ICD-10-CM | POA: Diagnosis not present

## 2020-03-01 DIAGNOSIS — N8500 Endometrial hyperplasia, unspecified: Secondary | ICD-10-CM | POA: Diagnosis not present

## 2020-03-01 DIAGNOSIS — Z319 Encounter for procreative management, unspecified: Secondary | ICD-10-CM | POA: Diagnosis not present

## 2020-03-03 MED FILL — MENOPUR 75 UNIT VIAL: 75 | 15 days supply | Qty: 15 | Fill #0

## 2020-03-03 MED FILL — PROGESTERONE OIL 50 MG/ML V: 50 | 30 days supply | Qty: 30 | Fill #0

## 2020-03-03 MED FILL — PREGNYL 10,000 UNITS VIAL: 10000 | 1 days supply | Qty: 1 | Fill #0

## 2020-03-03 MED FILL — ESTRADIOL 2 MG TABS: 2 | 30 days supply | Qty: 60 | Fill #0

## 2020-03-03 MED FILL — BD NEEDLES 22GX1.5: 22G X 1-1/2 | 30 days supply | Qty: 30 | Fill #0

## 2020-03-03 MED FILL — SM ALCOHOL 70% PREP PADS: 70 | 100 days supply | Qty: 100 | Fill #0

## 2020-03-03 MED FILL — DOTTI 0.1 MG/24HR PTTW: 0.1 | 28 days supply | Qty: 8 | Fill #0

## 2020-03-03 MED FILL — BD 3 ML SYRINGE 18GX1-1/2: 18G X 1-1/2 | 30 days supply | Qty: 60 | Fill #0

## 2020-03-03 MED FILL — SHARPS COLLECTOR 1.4QT: 1 days supply | Qty: 1 | Fill #0

## 2020-03-03 MED FILL — BD NEEDLES 30GX0.5: 30G X 1/2" | 30 days supply | Qty: 30 | Fill #0

## 2020-03-03 MED FILL — METHYLPREDNISOLONE 8 MG TAB: 8 | 4 days supply | Qty: 8 | Fill #0

## 2020-03-03 MED FILL — CETROTIDE 0.25 MG KIT: 0.25 | 6 days supply | Qty: 6 | Fill #0

## 2020-03-03 MED FILL — GONAL-F 1,050 UNITS VIAL: 1050 | 11 days supply | Qty: 3 | Fill #0

## 2020-03-11 DIAGNOSIS — Z3183 Encounter for assisted reproductive fertility procedure cycle: Secondary | ICD-10-CM | POA: Diagnosis not present

## 2020-03-11 DIAGNOSIS — Z113 Encounter for screening for infections with a predominantly sexual mode of transmission: Secondary | ICD-10-CM | POA: Diagnosis not present

## 2020-03-12 MED FILL — DOXYCYCLINE HYCLATE 100 MG: 100 | 20 days supply | Qty: 40 | Fill #0

## 2020-03-16 DIAGNOSIS — E2839 Other primary ovarian failure: Secondary | ICD-10-CM | POA: Diagnosis not present

## 2020-03-16 DIAGNOSIS — Z3183 Encounter for assisted reproductive fertility procedure cycle: Secondary | ICD-10-CM | POA: Diagnosis not present

## 2020-03-16 DIAGNOSIS — Z113 Encounter for screening for infections with a predominantly sexual mode of transmission: Secondary | ICD-10-CM | POA: Diagnosis not present

## 2020-03-16 DIAGNOSIS — E288 Other ovarian dysfunction: Secondary | ICD-10-CM | POA: Diagnosis not present

## 2020-03-16 DIAGNOSIS — N946 Dysmenorrhea, unspecified: Secondary | ICD-10-CM | POA: Diagnosis not present

## 2020-03-19 DIAGNOSIS — E2839 Other primary ovarian failure: Secondary | ICD-10-CM | POA: Diagnosis not present

## 2020-03-19 DIAGNOSIS — E288 Other ovarian dysfunction: Secondary | ICD-10-CM | POA: Diagnosis not present

## 2020-03-19 DIAGNOSIS — Z3183 Encounter for assisted reproductive fertility procedure cycle: Secondary | ICD-10-CM | POA: Diagnosis not present

## 2020-03-19 DIAGNOSIS — N946 Dysmenorrhea, unspecified: Secondary | ICD-10-CM | POA: Diagnosis not present

## 2020-03-19 DIAGNOSIS — Z113 Encounter for screening for infections with a predominantly sexual mode of transmission: Secondary | ICD-10-CM | POA: Diagnosis not present

## 2020-03-22 DIAGNOSIS — Z113 Encounter for screening for infections with a predominantly sexual mode of transmission: Secondary | ICD-10-CM | POA: Diagnosis not present

## 2020-03-22 DIAGNOSIS — E288 Other ovarian dysfunction: Secondary | ICD-10-CM | POA: Diagnosis not present

## 2020-03-22 DIAGNOSIS — N946 Dysmenorrhea, unspecified: Secondary | ICD-10-CM | POA: Diagnosis not present

## 2020-03-22 DIAGNOSIS — Z3183 Encounter for assisted reproductive fertility procedure cycle: Secondary | ICD-10-CM | POA: Diagnosis not present

## 2020-03-22 DIAGNOSIS — E2839 Other primary ovarian failure: Secondary | ICD-10-CM | POA: Diagnosis not present

## 2020-03-24 DIAGNOSIS — Z3183 Encounter for assisted reproductive fertility procedure cycle: Secondary | ICD-10-CM | POA: Diagnosis not present

## 2020-03-24 DIAGNOSIS — Z113 Encounter for screening for infections with a predominantly sexual mode of transmission: Secondary | ICD-10-CM | POA: Diagnosis not present

## 2020-03-24 DIAGNOSIS — E2839 Other primary ovarian failure: Secondary | ICD-10-CM | POA: Diagnosis not present

## 2020-03-24 DIAGNOSIS — N946 Dysmenorrhea, unspecified: Secondary | ICD-10-CM | POA: Diagnosis not present

## 2020-03-24 DIAGNOSIS — E288 Other ovarian dysfunction: Secondary | ICD-10-CM | POA: Diagnosis not present

## 2020-03-26 MED FILL — GONAL-F 1,050 UNITS VIAL: 1050 | 3 days supply | Qty: 1 | Fill #0

## 2020-03-27 DIAGNOSIS — N946 Dysmenorrhea, unspecified: Secondary | ICD-10-CM | POA: Diagnosis not present

## 2020-03-27 DIAGNOSIS — Z113 Encounter for screening for infections with a predominantly sexual mode of transmission: Secondary | ICD-10-CM | POA: Diagnosis not present

## 2020-03-27 DIAGNOSIS — E2839 Other primary ovarian failure: Secondary | ICD-10-CM | POA: Diagnosis not present

## 2020-03-27 DIAGNOSIS — Z3183 Encounter for assisted reproductive fertility procedure cycle: Secondary | ICD-10-CM | POA: Diagnosis not present

## 2020-03-27 DIAGNOSIS — E288 Other ovarian dysfunction: Secondary | ICD-10-CM | POA: Diagnosis not present

## 2020-03-29 DIAGNOSIS — Z3183 Encounter for assisted reproductive fertility procedure cycle: Secondary | ICD-10-CM | POA: Diagnosis not present

## 2020-04-03 ENCOUNTER — Telehealth: Payer: 59 | Admitting: Emergency Medicine

## 2020-04-03 DIAGNOSIS — B3731 Acute candidiasis of vulva and vagina: Secondary | ICD-10-CM

## 2020-04-03 DIAGNOSIS — B373 Candidiasis of vulva and vagina: Secondary | ICD-10-CM | POA: Diagnosis not present

## 2020-04-03 MED ORDER — FLUCONAZOLE 150 MG PO TABS: 150.0000 mg | ORAL_TABLET | Freq: Every day | ORAL | 0 refills | Status: AC

## 2020-04-03 NOTE — Progress Notes (Signed)
Time spent: 10 min  Debbie Harris,   We are sorry that you are not feeling well. Here is how we plan to help!  Based on what you shared with me it looks like you: May have a yeast vaginosis  Vaginosis is an inflammation of the vagina that can result in discharge, itching and pain. The cause is usually a change in the normal balance of vaginal bacteria or an infection. Vaginosis can also result from reduced estrogen levels after menopause.  The most common causes of vaginosis are:   Bacterial vaginosis which results from an overgrowth of one on several organisms that are normally present in your vagina.   Yeast infections which are caused by a naturally occurring fungus called candida.   Vaginal atrophy (atrophic vaginosis) which results from the thinning of the vagina from reduced estrogen levels after menopause.   Trichomoniasis which is caused by a parasite and is commonly transmitted by sexual intercourse.  Factors that increase your risk of developing vaginosis include: Marland Kitchen Medications, such as antibiotics and steroids . Uncontrolled diabetes . Use of hygiene products such as bubble bath, vaginal spray or vaginal deodorant . Douching . Wearing damp or tight-fitting clothing . Using an intrauterine device (IUD) for birth control . Hormonal changes, such as those associated with pregnancy, birth control pills or menopause . Sexual activity . Having a sexually transmitted infection  Your treatment plan is A single Diflucan (fluconazole) 150mg  tablet once.  I have electronically sent this prescription into the pharmacy that you have chosen.  I have sent a prescription for 2 tablets.  Take one table 150 mg on day 0 (today) and repeat a second dose of one tablet 150 mg on day 3 (72 hours) if symptoms have not improved.    Skin break down, irritation and even fissures can occur during yeast infections.  I recommend loose clothing.  Apply a generous layer of petrolatum ointment  (Aquaphor, etc) to avoid rubbing throughout the day and while walking/sitting. If you have pain with urination I recommend using a peri-bottle (or jar) and pouring warm water against your skin while you are urinating.  This can help with burning sensation while urinating.  Other recommended home remedies including sitting in a warm bath and lightly patting the skin dry, and applying a generous coat of petrolatum ointment.    Be sure to take all of the medication as directed. Stop taking any medication if you develop a rash, tongue swelling or shortness of breath. Mothers who are breast feeding should consider pumping and discarding their breast milk while on these antibiotics. However, there is no consensus that infant exposure at these doses would be harmful.  Remember that medication creams can weaken latex condoms. Marland Kitchen   HOME CARE:  Good hygiene may prevent some types of vaginosis from recurring and may relieve some symptoms:  . Avoid baths, hot tubs and whirlpool spas. Rinse soap from your outer genital area after a shower, and dry the area well to prevent irritation. Don't use scented or harsh soaps, such as those with deodorant or antibacterial action. Marland Kitchen Avoid irritants. These include scented tampons and pads. . Wipe from front to back after using the toilet. Doing so avoids spreading fecal bacteria to your vagina.  Other things that may help prevent vaginosis include:  Marland Kitchen Don't douche. Your vagina doesn't require cleansing other than normal bathing. Repetitive douching disrupts the normal organisms that reside in the vagina and can actually increase your risk of vaginal infection.  Douching won't clear up a vaginal infection. . Use a latex condom. Both female and female latex condoms may help you avoid infections spread by sexual contact. . Wear cotton underwear. Also wear pantyhose with a cotton crotch. If you feel comfortable without it, skip wearing underwear to bed. Yeast thrives in [employer] Your symptoms should improve in the next day or two.  GET HELP RIGHT AWAY IF:  . You have pain in your lower abdomen ( pelvic area or over your ovaries) . You develop nausea or vomiting . You develop a fever . Your discharge changes or worsens . You have persistent pain with intercourse . You develop shortness of breath, a rapid pulse, or you faint.  These symptoms could be signs of problems or infections that need to be evaluated by a medical provider now.  MAKE SURE YOU    Understand these instructions.  Will watch your condition.  Will get help right away if you are not doing well or get worse.  Your e-visit answers were reviewed by a board certified advanced clinical practitioner to complete your personal care plan. Depending upon the condition, your plan could have included both over the counter or prescription medications. Please review your pharmacy choice to make sure that you have choses a pharmacy that is open for you to pick up any needed prescription, Your safety is important to [place]. If you have drug allergies check your prescription carefully.   You can use MyChart to ask questions about today's visit, request a non-urgent call back, or ask for a work or school excuse for 24 hours related to this e-Visit. If it has been greater than 24 hours you will need to follow up with your provider, or enter a new e-Visit to address those concerns. You will get a MyChart message within the next two days asking about your experience. I hope that your e-visit has been valuable and will speed your recovery.  I hope you feel better soon,   Carmon Sails, PA-C [facility]

## 2020-04-04 DIAGNOSIS — Z3183 Encounter for assisted reproductive fertility procedure cycle: Secondary | ICD-10-CM | POA: Diagnosis not present

## 2020-04-19 DIAGNOSIS — E288 Other ovarian dysfunction: Secondary | ICD-10-CM | POA: Diagnosis not present

## 2020-04-19 DIAGNOSIS — E2839 Other primary ovarian failure: Secondary | ICD-10-CM | POA: Diagnosis not present

## 2020-04-19 DIAGNOSIS — N946 Dysmenorrhea, unspecified: Secondary | ICD-10-CM | POA: Diagnosis not present

## 2020-04-19 DIAGNOSIS — Z3183 Encounter for assisted reproductive fertility procedure cycle: Secondary | ICD-10-CM | POA: Diagnosis not present

## 2020-04-19 DIAGNOSIS — Z113 Encounter for screening for infections with a predominantly sexual mode of transmission: Secondary | ICD-10-CM | POA: Diagnosis not present

## 2020-04-28 DIAGNOSIS — Z3183 Encounter for assisted reproductive fertility procedure cycle: Secondary | ICD-10-CM | POA: Diagnosis not present

## 2020-04-30 MED FILL — DOTTI 0.1 MG/24HR PTTW: 0.1 | 28 days supply | Qty: 8 | Fill #1

## 2020-05-04 MED FILL — BD 3 ML SYRINGE WITH NEEDLE: 23G X 1-1/2 | 30 days supply | Qty: 30 | Fill #0

## 2020-05-04 MED FILL — BD PRECISIONGLIDE NDL 25G: 25G X 1" | 30 days supply | Qty: 30 | Fill #0

## 2020-05-06 DIAGNOSIS — Z3200 Encounter for pregnancy test, result unknown: Secondary | ICD-10-CM | POA: Diagnosis not present

## 2020-05-06 DIAGNOSIS — Z3201 Encounter for pregnancy test, result positive: Secondary | ICD-10-CM | POA: Diagnosis not present

## 2020-05-10 DIAGNOSIS — Z3201 Encounter for pregnancy test, result positive: Secondary | ICD-10-CM | POA: Diagnosis not present

## 2020-05-10 DIAGNOSIS — Z3200 Encounter for pregnancy test, result unknown: Secondary | ICD-10-CM | POA: Diagnosis not present

## 2020-05-10 MED FILL — ESTRADIOL 2 MG TABS: 2 | 30 days supply | Qty: 60 | Fill #1

## 2020-05-18 MED FILL — PROGESTERONE OIL 50 MG/ML V: 50 | 30 days supply | Qty: 30 | Fill #1

## 2020-05-20 DIAGNOSIS — Z3200 Encounter for pregnancy test, result unknown: Secondary | ICD-10-CM | POA: Diagnosis not present

## 2020-05-24 ENCOUNTER — Other Ambulatory Visit (HOSPITAL_BASED_OUTPATIENT_CLINIC_OR_DEPARTMENT_OTHER): Payer: Self-pay | Admitting: Obstetrics and Gynecology

## 2020-05-24 MED FILL — ENDOMETRIN 100 MG SUPP: 100 | 28 days supply | Qty: 84 | Fill #0

## 2020-06-02 ENCOUNTER — Other Ambulatory Visit (HOSPITAL_BASED_OUTPATIENT_CLINIC_OR_DEPARTMENT_OTHER): Payer: Self-pay

## 2020-06-02 DIAGNOSIS — O0900 Supervision of pregnancy with history of infertility, unspecified trimester: Secondary | ICD-10-CM | POA: Diagnosis not present

## 2020-06-06 ENCOUNTER — Encounter: Payer: Self-pay | Admitting: Family Medicine

## 2020-06-09 ENCOUNTER — Other Ambulatory Visit: Payer: Self-pay | Admitting: Adult Health

## 2020-06-09 ENCOUNTER — Encounter: Payer: Self-pay | Admitting: Adult Health

## 2020-06-09 DIAGNOSIS — U071 COVID-19: Secondary | ICD-10-CM

## 2020-06-09 NOTE — Progress Notes (Signed)
I connected by phone with Debbie Harris on 06/09/2020 at 3:58 PM to discuss the potential use of a new treatment for mild to moderate COVID-19 viral infection in non-hospitalized patients.  This patient is a 34 y.o. female that meets the FDA criteria for Emergency Use Authorization of COVID monoclonal antibody casirivimab/imdevimab or bamlanivimab/eteseviamb.  Has a (+) direct SARS-CoV-2 viral test result  Has mild or moderate COVID-19   Is NOT hospitalized due to COVID-19  Is within 10 days of symptom onset  Has at least one of the high risk factor(s) for progression to severe COVID-19 and/or hospitalization as defined in EUA.  Specific high risk criteria : Pregnancy   Sx onset 06/06/2020  Recommended that she receive OK from ob-gyn about receiving therapy  Referred by Dr. Lorelei Pont, who recommends therapy   I have spoken and communicated the following to the patient or parent/caregiver regarding COVID monoclonal antibody treatment:  1. FDA has authorized the emergency use for the treatment of mild to moderate COVID-19 in adults and pediatric patients with positive results of direct SARS-CoV-2 viral testing who are 40 years of age and older weighing at least 40 kg, and who are at high risk for progressing to severe COVID-19 and/or hospitalization.  2. The significant known and potential risks and benefits of COVID monoclonal antibody, and the extent to which such potential risks and benefits are unknown.  3. Information on available alternative treatments and the risks and benefits of those alternatives, including clinical trials.  4. Patients treated with COVID monoclonal antibody should continue to self-isolate and use infection control measures (e.g., wear mask, isolate, social distance, avoid sharing personal items, clean and disinfect "high touch" surfaces, and frequent handwashing) according to CDC guidelines.   5. The patient or parent/caregiver has the option to accept or  refuse COVID monoclonal antibody treatment.  After reviewing this information with the patient, the patient has agreed to receive one of the available covid 19 monoclonal antibodies and will be provided an appropriate fact sheet prior to infusion. Scot Dock, NP 06/09/2020 3:58 PM

## 2020-06-10 ENCOUNTER — Ambulatory Visit (HOSPITAL_COMMUNITY): Payer: 59

## 2020-06-15 MED FILL — PROGESTERONE MICRONIZED 200: 200 | 14 days supply | Qty: 14 | Fill #0

## 2020-06-29 DIAGNOSIS — Z3480 Encounter for supervision of other normal pregnancy, unspecified trimester: Secondary | ICD-10-CM | POA: Diagnosis not present

## 2020-06-29 DIAGNOSIS — Z113 Encounter for screening for infections with a predominantly sexual mode of transmission: Secondary | ICD-10-CM | POA: Diagnosis not present

## 2020-06-29 DIAGNOSIS — O26849 Uterine size-date discrepancy, unspecified trimester: Secondary | ICD-10-CM | POA: Diagnosis not present

## 2020-06-29 DIAGNOSIS — N925 Other specified irregular menstruation: Secondary | ICD-10-CM | POA: Diagnosis not present

## 2020-06-29 LAB — OB RESULTS CONSOLE ANTIBODY SCREEN: Antibody Screen: NEGATIVE

## 2020-06-29 LAB — OB RESULTS CONSOLE GC/CHLAMYDIA
Chlamydia: NEGATIVE
Gonorrhea: NEGATIVE

## 2020-06-29 LAB — OB RESULTS CONSOLE HIV ANTIBODY (ROUTINE TESTING): HIV: NONREACTIVE

## 2020-06-29 LAB — OB RESULTS CONSOLE RUBELLA ANTIBODY, IGM: Rubella: IMMUNE

## 2020-06-29 LAB — OB RESULTS CONSOLE HEPATITIS B SURFACE ANTIGEN: Hepatitis B Surface Ag: NEGATIVE

## 2020-06-29 LAB — OB RESULTS CONSOLE RPR: RPR: NONREACTIVE

## 2020-07-28 DIAGNOSIS — Z3480 Encounter for supervision of other normal pregnancy, unspecified trimester: Secondary | ICD-10-CM | POA: Diagnosis not present

## 2020-07-28 DIAGNOSIS — Z369 Encounter for antenatal screening, unspecified: Secondary | ICD-10-CM | POA: Diagnosis not present

## 2020-07-28 DIAGNOSIS — Z3481 Encounter for supervision of other normal pregnancy, first trimester: Secondary | ICD-10-CM | POA: Diagnosis not present

## 2020-08-04 ENCOUNTER — Other Ambulatory Visit: Payer: Self-pay

## 2020-08-04 ENCOUNTER — Other Ambulatory Visit: Payer: Self-pay | Admitting: *Deleted

## 2020-08-21 NOTE — L&D Delivery Note (Addendum)
Delivery Note Serin Thornell is a G1P0 at [redacted]w[redacted]d who had a spontaneous delivery at 42 a viable female was delivered via  LOA.  APGAR: 9,9 ; weight 7lb 14.5oz  .    Admitted for induction of labor for term pregnancy complicated by AMA. Induced with cytotec, cook catheter, pitocin. Induction prolonged, however once patient entered active labor, progressed normally. Received epidural for pain management. Pushed for 3 hr 27 mins. Baby was delivered without difficulty. Loose nuchal cord.  Placenta delivered spontaneously and intact. The fundus was found to be firm. The lower uterine segment was noted to be atonic with steady trickle of blood. IM methergine was given, followed by IV TXA.   2nd degree perineal laceration was noted and repaired with 2-0 and 3-0 vicryl in the usual fashion.  The cervix was inspected and found to be without laceration. DRE with good rectal tone and no sutures.   Bimanual exam significant for blood clots, which were expressed. Misoprostol 1023mcg was then given followed by hemabate due to continued bleeding. Patient had good response at this point. Bimanual exam demonstrated firm fundus and lower uterine segment without clots. Patient's vitals normal throughout however she became pale. Stat CBC ordered.  QBL 865mL   Instrument and gauze counts were correct at the end of the procedure.  Placenta status: to pathology  Anesthesia:  epidural Episiotomy:  none Lacerations:  2nd degree perineal Suture Repair: 2.0 3.0 vicryl Est. Blood Loss (mL):  835mL  Mom to postpartum.  Baby to Couplet care / Skin to Skin.  Rowland Lathe 01/15/2021, 4:26 PM

## 2020-08-27 ENCOUNTER — Encounter: Payer: Self-pay | Admitting: *Deleted

## 2020-08-31 ENCOUNTER — Ambulatory Visit: Payer: 59 | Admitting: *Deleted

## 2020-08-31 ENCOUNTER — Other Ambulatory Visit: Payer: Self-pay | Admitting: Obstetrics

## 2020-08-31 ENCOUNTER — Ambulatory Visit: Payer: 59 | Attending: Obstetrics

## 2020-08-31 ENCOUNTER — Other Ambulatory Visit: Payer: Self-pay | Admitting: *Deleted

## 2020-08-31 ENCOUNTER — Other Ambulatory Visit: Payer: Self-pay

## 2020-08-31 VITALS — BP 94/58 | HR 72

## 2020-08-31 DIAGNOSIS — Z363 Encounter for antenatal screening for malformations: Secondary | ICD-10-CM | POA: Insufficient documentation

## 2020-08-31 DIAGNOSIS — O09519 Supervision of elderly primigravida, unspecified trimester: Secondary | ICD-10-CM | POA: Diagnosis not present

## 2020-09-08 DIAGNOSIS — Z369 Encounter for antenatal screening, unspecified: Secondary | ICD-10-CM | POA: Diagnosis not present

## 2020-09-20 DIAGNOSIS — O09812 Supervision of pregnancy resulting from assisted reproductive technology, second trimester: Secondary | ICD-10-CM | POA: Diagnosis not present

## 2020-09-20 DIAGNOSIS — O358XX0 Maternal care for other (suspected) fetal abnormality and damage, not applicable or unspecified: Secondary | ICD-10-CM | POA: Diagnosis not present

## 2020-09-20 DIAGNOSIS — O09522 Supervision of elderly multigravida, second trimester: Secondary | ICD-10-CM | POA: Diagnosis not present

## 2020-09-24 ENCOUNTER — Encounter (HOSPITAL_COMMUNITY): Payer: Self-pay | Admitting: Anesthesiology

## 2020-09-24 NOTE — Progress Notes (Signed)
As per Dr Elvis Coil request I contacted Debbie Harris to discuss  her upcomming birth and plan for epidural anesthesia.  She was concerned that her Scoliosis was severe and going to be a problem such  that she would not be eligible or possible.  I reviewed her chest x-rays and their appears to be NO significant curvature down to L3.  While The scan does not show the entirety of her spine I explained to her that no significant compensatory curvature was evident in the cervical or thoracic spine leading me to believe that whatever scoliosis she may have is mild at best and that epidural anesthesia is not contraindicated.

## 2020-09-24 NOTE — Addendum Note (Signed)
Encounter addended by: Janeece Riggers, MD on: 09/24/2020 10:06 AM  Actions taken: Clinical Note Signed

## 2020-10-06 DIAGNOSIS — Z3480 Encounter for supervision of other normal pregnancy, unspecified trimester: Secondary | ICD-10-CM | POA: Diagnosis not present

## 2020-10-06 DIAGNOSIS — Z369 Encounter for antenatal screening, unspecified: Secondary | ICD-10-CM | POA: Diagnosis not present

## 2020-10-07 ENCOUNTER — Inpatient Hospital Stay (HOSPITAL_COMMUNITY): Payer: 59

## 2020-10-07 ENCOUNTER — Inpatient Hospital Stay (HOSPITAL_COMMUNITY)
Admission: AD | Admit: 2020-10-07 | Discharge: 2020-10-07 | Disposition: A | Discharge status: Home / Self Care | Payer: 59 | Attending: Obstetrics and Gynecology | Admitting: Obstetrics and Gynecology

## 2020-10-07 ENCOUNTER — Encounter (HOSPITAL_COMMUNITY): Payer: Self-pay | Admitting: Obstetrics and Gynecology

## 2020-10-07 ENCOUNTER — Other Ambulatory Visit (HOSPITAL_COMMUNITY): Payer: Self-pay | Admitting: Advanced Practice Midwife

## 2020-10-07 ENCOUNTER — Other Ambulatory Visit: Payer: Self-pay

## 2020-10-07 DIAGNOSIS — Z3A25 25 weeks gestation of pregnancy: Secondary | ICD-10-CM | POA: Diagnosis not present

## 2020-10-07 DIAGNOSIS — N1330 Unspecified hydronephrosis: Secondary | ICD-10-CM | POA: Diagnosis not present

## 2020-10-07 DIAGNOSIS — R109 Unspecified abdominal pain: Secondary | ICD-10-CM | POA: Diagnosis not present

## 2020-10-07 DIAGNOSIS — M549 Dorsalgia, unspecified: Secondary | ICD-10-CM | POA: Diagnosis not present

## 2020-10-07 DIAGNOSIS — O26832 Pregnancy related renal disease, second trimester: Secondary | ICD-10-CM | POA: Insufficient documentation

## 2020-10-07 DIAGNOSIS — O99891 Other specified diseases and conditions complicating pregnancy: Secondary | ICD-10-CM | POA: Diagnosis not present

## 2020-10-07 DIAGNOSIS — N200 Calculus of kidney: Secondary | ICD-10-CM | POA: Diagnosis not present

## 2020-10-07 LAB — COMPREHENSIVE METABOLIC PANEL
ALT: 32 U/L (ref 0–44)
AST: 30 U/L (ref 15–41)
Albumin: 3.2 g/dL — ABNORMAL LOW (ref 3.5–5.0)
Alkaline Phosphatase: 58 U/L (ref 38–126)
Anion gap: 12 (ref 5–15)
BUN: 10 mg/dL (ref 6–20)
CO2: 18 mmol/L — ABNORMAL LOW (ref 22–32)
Calcium: 8.9 mg/dL (ref 8.9–10.3)
Chloride: 107 mmol/L (ref 98–111)
Creatinine, Ser: 0.82 mg/dL (ref 0.44–1.00)
GFR, Estimated: >60 mL/min (ref >60)
Glucose, Bld: 80 mg/dL (ref 70–99)
Potassium: 3.7 mmol/L (ref 3.5–5.1)
Sodium: 137 mmol/L (ref 135–145)
Total Bilirubin: 0.7 mg/dL (ref 0.3–1.2)
Total Protein: 6.3 g/dL — ABNORMAL LOW (ref 6.5–8.1)

## 2020-10-07 LAB — URINALYSIS, ROUTINE W REFLEX MICROSCOPIC
Bilirubin Urine: NEGATIVE
Glucose, UA: NEGATIVE mg/dL
Hgb urine dipstick: NEGATIVE
Ketones, ur: 5 mg/dL — AB
Leukocytes,Ua: NEGATIVE
Nitrite: NEGATIVE
Protein, ur: NEGATIVE mg/dL
Specific Gravity, Urine: 1.013 (ref 1.005–1.030)
pH: 7 (ref 5.0–8.0)

## 2020-10-07 LAB — CBC WITH DIFFERENTIAL/PLATELET
Abs Immature Granulocytes: 0.05 10*3/uL (ref 0.00–0.07)
Basophils Absolute: 0 10*3/uL (ref 0.0–0.1)
Basophils Relative: 0 %
Eosinophils Absolute: 0 10*3/uL (ref 0.0–0.5)
Eosinophils Relative: 0 %
HCT: 35.3 % — ABNORMAL LOW (ref 36.0–46.0)
Hemoglobin: 12 g/dL (ref 12.0–15.0)
Immature Granulocytes: 0 %
Lymphocytes Relative: 7 %
Lymphs Abs: 0.8 10*3/uL (ref 0.7–4.0)
MCH: 30.8 pg (ref 26.0–34.0)
MCHC: 34 g/dL (ref 30.0–36.0)
MCV: 90.7 fL (ref 80.0–100.0)
Monocytes Absolute: 0.7 10*3/uL (ref 0.1–1.0)
Monocytes Relative: 6 %
Neutro Abs: 10.4 10*3/uL — ABNORMAL HIGH (ref 1.7–7.7)
Neutrophils Relative %: 87 %
Platelets: 222 10*3/uL (ref 150–400)
RBC: 3.89 MIL/uL (ref 3.87–5.11)
RDW: 12.4 % (ref 11.5–15.5)
WBC: 12 10*3/uL — ABNORMAL HIGH (ref 4.0–10.5)
nRBC: 0 % (ref 0.0–0.2)

## 2020-10-07 MED ORDER — OXYCODONE-ACETAMINOPHEN 5-325 MG PO TABS: 2.0000 | ORAL_TABLET | ORAL | 0 refills | Status: DC | PRN

## 2020-10-07 MED ORDER — ONDANSETRON HCL 4 MG/2ML IJ SOLN
4.0000 mg | Freq: Once | INTRAMUSCULAR | Status: AC
  Administered 2020-10-07: 4 mg via INTRAVENOUS
  Filled 2020-10-07: qty 2

## 2020-10-07 MED ORDER — PROMETHAZINE HCL 12.5 MG PO TABS: 12.5000 mg | ORAL_TABLET | Freq: Four times a day (QID) | ORAL | 0 refills | Status: DC | PRN

## 2020-10-07 MED ORDER — HYDROMORPHONE HCL 1 MG/ML IJ SOLN
0.5000 mg | Freq: Once | INTRAMUSCULAR | Status: AC
  Administered 2020-10-07: 0.5 mg via INTRAVENOUS

## 2020-10-07 MED ORDER — HYDROMORPHONE HCL 1 MG/ML IJ SOLN
0.5000 mg | Freq: Once | INTRAMUSCULAR | Status: AC
  Administered 2020-10-07: 0.5 mg via INTRAVENOUS
  Filled 2020-10-07: qty 1

## 2020-10-07 MED FILL — PROMETHAZINE 12.5 MG TABLET: 12.5 | 5 days supply | Qty: 30 | Fill #0

## 2020-10-07 MED FILL — OXYCODONE-ACETAMINOPHEN 5-3: 5-325 | 5 days supply | Qty: 30 | Fill #0

## 2020-10-07 NOTE — MAU Provider Note (Signed)
History     CSN: [id number]  Arrival date and time: 10/07/20 1212   Event Date/Time   First Provider Initiated Contact with Patient 10/07/20 1253      Chief Complaint  Patient presents with  . Back Pain  . Abdominal Pain   HPI Debbie Harris is a 35 y.o. G1P0 at [redacted]w[redacted]d who presents to MAU with chief complaint of back pain. This is a new problem,acute onset today around 0900. Patient's pain is 10/10, located in her right flank and radiates towards her right lower abdomen. 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 She endorses feeling feverish but did not check her temperature. She also reports generalized nausea but declines antiemetics on initial assessment.  Patient's PMH includes U-shaped kidney and history of ureter surgery as a child. She endorses hx of Pyelo and recurrent UTIs. She denies suprapubic pain,  vaginal bleeding, leaking of fluid, dysuria, decreased fetal movement, fever, falls, or recent illness.   Patient receives care with [facility].  OB History    Gravida  1   Para      Term      Preterm      AB      Living        SAB      IAB      Ectopic      Multiple      Live Births              Past Medical History:  Diagnosis Date  . Depression     Past Surgical History:  Procedure Laterality Date  . BREAST ENHANCEMENT SURGERY Bilateral 2018  . URETER SURGERY  1992    Family History  Problem Relation Age of Onset  . Hypertension Father   . Hyperlipidemia Father   . Breast cancer Maternal Grandmother     Social History   Tobacco Use  . Smoking status: Never Smoker  . Smokeless tobacco: Never Used  Vaping Use  . Vaping Use: Never used  Substance Use Topics  . Alcohol use: Not Currently    Comment: social  . Drug use: No    Allergies:  Allergies  Allergen Reactions  . Sulfa Antibiotics      Medications Prior to Admission  Medication Sig Dispense Refill Last Dose  . Prenatal Vit-Fe Fumarate-FA (PRENATAL MULTIVITAMIN) TABS tablet Take 1 tablet by mouth daily at 12 noon.   10/06/2020 at Unknown time  . Cholecalciferol (VITAMIN D) 125 MCG (5000 UT) CAPS Take by mouth. (Patient not taking: Reported on 08/31/2020)     . Coenzyme Q10 (COQ-10 PO) Take 200 mg by mouth daily.     Marland Kitchen escitalopram (LEXAPRO) 10 MG tablet Take 1 tablet (10 mg total) by mouth daily. (Patient not taking: Reported on 08/31/2020) 90 tablet 3   . letrozole ([place]) 2.5 MG tablet TAKE 3 TABLETS BY MOUTH ONCE A DAY ON CYCLE DAYS 3 THROUGH 7 OF YOUR MENSTRUAL CYCLE (Patient not taking: Reported on 08/31/2020)     . Multiple Vitamin (MULTIVITAMIN) tablet Take 2 tablets by mouth daily. (Patient not taking: Reported on 08/31/2020)     . OVIDREL 250 MCG/0.5ML injection Inject into the skin daily. (Patient not taking: Reported on 08/31/2020)       Review of Systems  Gastrointestinal: Positive for abdominal pain.  Genitourinary: Positive for flank pain. Negative for  difficulty urinating, dysuria and vaginal bleeding.  Musculoskeletal: Positive for back pain.  All other systems reviewed and are negative.  Physical Exam   Blood pressure 112/76, pulse 73, temperature 97.9 F (36.6 C), last menstrual period 04/09/2020, SpO2 100 %.  Physical Exam Vitals and nursing note reviewed. Exam conducted with a chaperone present.  Constitutional:      Appearance: She is well-developed.  Cardiovascular:     Heart sounds: Normal heart sounds.  Pulmonary:     Effort: Pulmonary effort is normal.     Breath sounds: Normal breath sounds.  Abdominal:     Palpations: Abdomen is soft.     Tenderness: There is no abdominal tenderness. There is right CVA tenderness. There is no left CVA tenderness.     Comments: Gravid  Skin:    Capillary Refill: Capillary refill takes less than 2 seconds.  Neurological:     Mental Status: She is alert  and oriented to person, place, and time.     MAU Course  Procedures  --Abridged initial assessment, then CNM returned to bedside 15 minutes after administration of analgesia for more detailed PE.  --Reactive tracing: baseline 135, mod var, 10 x 10 accels, no decels  --Toco: UI  Orders Placed This Encounter  Procedures  . US Renal  . Urinalysis, Routine w reflex microscopic  . CBC with Differential/Platelet  . Comprehensive metabolic panel  . Ambulatory referral to Urology  . Insert peripheral IV  . Saline lock IV  . Discharge patient   Patient Vitals for the past 24 hrs:  BP Temp Pulse SpO2 Weight  10/07/20 1530 -- 98.3 F (36.8 C) 74 100 % --  10/07/20 1405 -- -- -- 100 % --  10/07/20 1355 -- -- -- 100 % --  10/07/20 1350 -- -- -- 100 % --  10/07/20 1345 -- -- -- 100 % --  10/07/20 1340 -- -- -- 100 % --  10/07/20 1335 -- -- -- 100 % --  10/07/20 1330 -- -- -- 100 % --  10/07/20 1325 -- -- -- 100 % --  10/07/20 1320 -- -- -- 99 % --  10/07/20 1315 -- -- -- 100 % --  10/07/20 1310 -- -- -- 100 % --  10/07/20 1305 -- -- -- 100 % --  10/07/20 1300 -- -- -- 100 % --  10/07/20 1255 -- -- -- 100 % --  10/07/20 1250 -- -- -- 100 % --  10/07/20 1245 -- -- -- 100 % --  10/07/20 1240 -- -- -- 100 % --  10/07/20 1237 112/76 97.9 F (36.6 C) 73 100 % 65 kg   US Renal  Result Date: 10/07/2020 CLINICAL DATA:  RIGHT flank pain, pregnant EXAM: RENAL / URINARY TRACT ULTRASOUND COMPLETE COMPARISON:  None FINDINGS: Right Kidney: Renal measurements: 9.1 x 5.4 x 4.8 cm = volume: 123 mL. Mild LEFT hydronephrosis. No renal mass or shadowing intrarenal calcification. RIGHT ureter appears mildly dilated. A 6 mm suspected filling defect is seen within the RIGHT ureter, image 38, though the ureter beyond this potential calculus remains dilated. Left Kidney: Renal measurements: 8.0 x 4.4 x 4.4 cm = volume: 80 mL. Normal cortical thickness and echogenicity. Minimal collecting system dilatation.  No mass or shadowing calcification. Bladder: Poorly assessed due to gravid uterus and low volume. Other: N/A IMPRESSION: Minimal LEFT hydronephrosis. Mild RIGHT hydronephrosis with mild RIGHT ureteral dilatation. Potential 6 mm calculus RIGHT ureteral calculus though the RIGHT ureter beyond this intraluminal focus remains dilated.  Electronically Signed   By: Lavonia Dana M.D.   On: 10/07/2020 16:09   Meds ordered this encounter  Medications  . HYDROmorphone (DILAUDID) injection 0.5 mg  . HYDROmorphone (DILAUDID) injection 0.5 mg    Pain unresponsive to previous 0.5mg   . ondansetron (ZOFRAN) injection 4 mg  . oxyCODONE-acetaminophen (PERCOCET/ROXICET) 5-325 MG tablet    Sig: Take 2 tablets by mouth every 4 (four) hours as needed for up to 5 days for severe pain.    Dispense:  30 tablet    Refill:  0    Order Specific Question:   Supervising Provider    Answer:   Aletha Halim [id number]  . promethazine (PHENERGAN) 12.5 MG tablet    Sig: Take 1 tablet (12.5 mg total) by mouth every 6 (six) hours as needed for nausea or vomiting.    Dispense:  30 tablet    Refill:  0    Order Specific Question:   Supervising Provider    Answer:   Aletha Halim [[id number]]    Assessment and Plan  --35 y.o. G1P0 at [redacted]w[redacted]d  --Reactive tracing --Non-obstructing right kidney stone --Unable to prescribe Flomax due to patient allergy --Strainer with patient, analgesia, antiemetic to pharmacy --Discharge home in stable condition  F/U: --Amb referral to Urology  Darlina Rumpf, CNM 10/07/2020, 8:19 PM

## 2020-10-07 NOTE — Discharge Instructions (Signed)
Kidney Stones  Kidney stones are solid, rock-like deposits that form inside of the kidneys. The kidneys are a pair of organs that make urine. A kidney stone may form in a kidney and move into other parts of the urinary tract, including the tubes that connect the kidneys to the bladder (ureters), the bladder, and the tube that carries urine out of the body (urethra). As the stone moves through these areas, it can cause intense pain and block the flow of urine. Kidney stones are created when high levels of certain minerals are found in the urine. The stones are usually passed out of the body through urination, but in some cases, medical treatment may be needed to remove them. What are the causes? Kidney stones may be caused by:  A condition in which certain glands produce too much parathyroid hormone (primary hyperparathyroidism), which causes too much calcium buildup in the blood.  A buildup of uric acid crystals in the bladder (hyperuricosuria). Uric acid is a chemical that the body produces when you eat certain foods. It usually exits the body in the urine.  Narrowing (stricture) of one or both of the ureters.  A kidney blockage that is present at birth (congenital obstruction).  Past surgery on the kidney or the ureters, such as gastric bypass surgery. What increases the risk? The following factors may make you more likely to develop this condition:  Having had a kidney stone in the past.  Having a family history of kidney stones.  Not drinking enough water.  Eating a diet that is high in protein, salt (sodium), or sugar.  Being overweight or obese. What are the signs or symptoms? Symptoms of a kidney stone may include:  Pain in the side of the abdomen, right below the ribs (flank pain). Pain usually spreads (radiates) to the groin.  Needing to urinate frequently or urgently.  Painful urination.  Blood in the urine (hematuria).  Nausea.  Vomiting.  Fever and chills. How  is this diagnosed? This condition may be diagnosed based on:  Your symptoms and medical history.  A physical exam.  Blood tests.  Urine tests. These may be done before and after the stone passes out of your body through urination.  Imaging tests, such as a CT scan, abdominal X-ray, or ultrasound.  A procedure to examine the inside of the bladder (cystoscopy). How is this treated? Treatment for kidney stones depends on the size, location, and makeup of the stones. Kidney stones will often pass out of the body through urination. You may need to:  Increase your fluid intake to help pass the stone. In some cases, you may be given fluids through an IV and may need to be monitored at the hospital.  Take medicine for pain.  Make changes in your diet to help prevent kidney stones from coming back. Sometimes, medical procedures are needed to remove a kidney stone. This may involve:  A procedure to break up kidney stones using: ? A focused beam of light (laser therapy). ? Shock waves (extracorporeal shock wave lithotripsy).  Surgery to remove kidney stones. This may be needed if you have severe pain or have stones that block your urinary tract. Follow these instructions at home: Medicines  Take over-the-counter and prescription medicines only as told by your health care provider.  Ask your health care provider if the medicine prescribed to you requires you to avoid driving or using heavy machinery. Eating and drinking  Drink enough fluid to keep your urine pale yellow.   You may be instructed to drink at least 8-10 glasses of water each day. This will help you pass the kidney stone.  If directed, change your diet. This may include: ? Limiting how much sodium you eat. ? Eating more fruits and vegetables. ? Limiting how much animal protein--such as red meat, poultry, fish, and eggs--you eat.  Follow instructions from your health care provider about eating or drinking  restrictions. General instructions  Collect urine samples as told by your health care provider. You may need to collect a urine sample: ? 24 hours after you pass the stone. ? 8-12 weeks after passing the kidney stone, and every 6-12 months after that.  Strain your urine every time you urinate, for as long as directed. Use the strainer that your health care provider recommends.  Do not throw out the kidney stone after passing it. Keep the stone so it can be tested by your health care provider. Testing the makeup of your kidney stone may help prevent you from getting kidney stones in the future.  Keep all follow-up visits as told by your health care provider. This is important. You may need follow-up X-rays or ultrasounds to make sure that your stone has passed. How is this prevented? To prevent another kidney stone:  Drink enough fluid to keep your urine pale yellow. This is the best way to prevent kidney stones.  Eat a healthy diet and follow recommendations from your health care provider about foods to avoid. You may be instructed to eat a low-protein diet. Recommendations vary depending on the type of kidney stone that you have.  Maintain a healthy weight.   Where to find more information  National Kidney Foundation (NKF): www.kidney.org  [employer] (UCF): www.urologyhealth.org Contact a health care provider if:  You have pain that gets worse or does not get better with medicine. Get help right away if:  You have a fever or chills.  You develop severe pain.  You develop new abdominal pain.  You faint.  You are unable to urinate. Summary  Kidney stones are solid, rock-like deposits that form inside of the kidneys.  Kidney stones can cause nausea, vomiting, blood in the urine, abdominal pain, and the urge to urinate frequently.  Treatment for kidney stones depends on the size, location, and makeup of the stones. Kidney stones will often pass out of the body  through urination.  Kidney stones can be prevented by drinking enough fluids, eating a healthy diet, and maintaining a healthy weight. This information is not intended to replace advice given to you by your health care provider. Make sure you discuss any questions you have with your health care provider. Document Revised: 12/24/2018 Document Reviewed: 12/24/2018 Elsevier Patient Education  2021 Elsevier Inc.  

## 2020-10-07 NOTE — MAU Note (Addendum)
Pt reports right sided constant pain started around 0900 and has increased in pain. Rates it 10/10. Denies pain or burning when urinating. Pt reports having prior kidney infxn and chronic UTI. Has u-shaped kidney and has ureter surgery for a tear as a child. Reports feeling feverish but current afebrile. Reprots +FM, denies VB, LOF, and ctx.

## 2020-10-19 ENCOUNTER — Institutional Professional Consult (permissible substitution): Payer: 59 | Admitting: Pediatrics

## 2020-10-19 DIAGNOSIS — Z369 Encounter for antenatal screening, unspecified: Secondary | ICD-10-CM | POA: Diagnosis not present

## 2020-10-27 DIAGNOSIS — M5489 Other dorsalgia: Secondary | ICD-10-CM | POA: Diagnosis not present

## 2020-11-16 ENCOUNTER — Ambulatory Visit: Payer: 59

## 2020-11-16 DIAGNOSIS — O09813 Supervision of pregnancy resulting from assisted reproductive technology, third trimester: Secondary | ICD-10-CM | POA: Diagnosis not present

## 2020-11-16 DIAGNOSIS — Z3A31 31 weeks gestation of pregnancy: Secondary | ICD-10-CM | POA: Diagnosis not present

## 2020-11-16 DIAGNOSIS — O09513 Supervision of elderly primigravida, third trimester: Secondary | ICD-10-CM | POA: Diagnosis not present

## 2020-12-01 ENCOUNTER — Other Ambulatory Visit (HOSPITAL_BASED_OUTPATIENT_CLINIC_OR_DEPARTMENT_OTHER): Payer: Self-pay

## 2020-12-01 DIAGNOSIS — Z23 Encounter for immunization: Secondary | ICD-10-CM | POA: Diagnosis not present

## 2020-12-01 MED ORDER — LIDOCAINE-HYDROCORTISONE ACE 3-1 % RE KIT
PACK | RECTAL | 0 refills | Status: DC
  Filled 2020-12-01: qty 1, 7d supply, fill #0

## 2020-12-02 ENCOUNTER — Other Ambulatory Visit (HOSPITAL_BASED_OUTPATIENT_CLINIC_OR_DEPARTMENT_OTHER): Payer: Self-pay

## 2020-12-03 DIAGNOSIS — Z3483 Encounter for supervision of other normal pregnancy, third trimester: Secondary | ICD-10-CM | POA: Diagnosis not present

## 2020-12-03 DIAGNOSIS — Z3482 Encounter for supervision of other normal pregnancy, second trimester: Secondary | ICD-10-CM | POA: Diagnosis not present

## 2020-12-09 ENCOUNTER — Other Ambulatory Visit: Payer: Self-pay

## 2020-12-09 ENCOUNTER — Ambulatory Visit (INDEPENDENT_AMBULATORY_CARE_PROVIDER_SITE_OTHER): Payer: 59 | Admitting: Pediatrics

## 2020-12-09 DIAGNOSIS — Z7681 Expectant parent(s) prebirth pediatrician visit: Secondary | ICD-10-CM

## 2020-12-11 ENCOUNTER — Encounter: Payer: Self-pay | Admitting: Pediatrics

## 2020-12-11 DIAGNOSIS — Z7681 Expectant parent(s) prebirth pediatrician visit: Secondary | ICD-10-CM | POA: Insufficient documentation

## 2020-12-11 NOTE — Progress Notes (Signed)
Prenatal counseling for impending newborn done-- Z76.81  

## 2020-12-17 DIAGNOSIS — O09813 Supervision of pregnancy resulting from assisted reproductive technology, third trimester: Secondary | ICD-10-CM | POA: Diagnosis not present

## 2020-12-17 DIAGNOSIS — Z3480 Encounter for supervision of other normal pregnancy, unspecified trimester: Secondary | ICD-10-CM | POA: Diagnosis not present

## 2020-12-21 DIAGNOSIS — O09813 Supervision of pregnancy resulting from assisted reproductive technology, third trimester: Secondary | ICD-10-CM | POA: Diagnosis not present

## 2020-12-22 LAB — OB RESULTS CONSOLE GBS: GBS: NEGATIVE

## 2020-12-24 ENCOUNTER — Telehealth (HOSPITAL_COMMUNITY): Payer: Self-pay | Admitting: *Deleted

## 2020-12-24 NOTE — Telephone Encounter (Signed)
Preadmission screen  

## 2020-12-27 ENCOUNTER — Telehealth (HOSPITAL_COMMUNITY): Payer: Self-pay | Admitting: *Deleted

## 2020-12-27 DIAGNOSIS — O09813 Supervision of pregnancy resulting from assisted reproductive technology, third trimester: Secondary | ICD-10-CM | POA: Diagnosis not present

## 2020-12-27 NOTE — Telephone Encounter (Signed)
Preadmission screen  

## 2020-12-30 ENCOUNTER — Telehealth (HOSPITAL_COMMUNITY): Payer: Self-pay | Admitting: *Deleted

## 2020-12-30 ENCOUNTER — Encounter (HOSPITAL_COMMUNITY): Payer: Self-pay | Admitting: *Deleted

## 2020-12-30 NOTE — Telephone Encounter (Signed)
Preadmission screen  

## 2021-01-03 DIAGNOSIS — O09813 Supervision of pregnancy resulting from assisted reproductive technology, third trimester: Secondary | ICD-10-CM | POA: Diagnosis not present

## 2021-01-03 DIAGNOSIS — O09513 Supervision of elderly primigravida, third trimester: Secondary | ICD-10-CM | POA: Diagnosis not present

## 2021-01-05 ENCOUNTER — Other Ambulatory Visit (HOSPITAL_COMMUNITY): Payer: 59

## 2021-01-07 ENCOUNTER — Inpatient Hospital Stay (HOSPITAL_COMMUNITY): Admission: AD | Admit: 2021-01-07 | Payer: 59 | Source: Home / Self Care | Admitting: Obstetrics & Gynecology

## 2021-01-07 ENCOUNTER — Inpatient Hospital Stay (HOSPITAL_COMMUNITY): Payer: 59

## 2021-01-12 ENCOUNTER — Other Ambulatory Visit (HOSPITAL_COMMUNITY)
Admission: RE | Admit: 2021-01-12 | Discharge: 2021-01-12 | Disposition: A | Discharge status: Home / Self Care | Payer: 59 | Source: Ambulatory Visit | Attending: Obstetrics and Gynecology | Admitting: Obstetrics and Gynecology

## 2021-01-12 DIAGNOSIS — O26893 Other specified pregnancy related conditions, third trimester: Secondary | ICD-10-CM | POA: Diagnosis present

## 2021-01-12 DIAGNOSIS — Z20822 Contact with and (suspected) exposure to covid-19: Secondary | ICD-10-CM | POA: Insufficient documentation

## 2021-01-12 DIAGNOSIS — Z01812 Encounter for preprocedural laboratory examination: Secondary | ICD-10-CM | POA: Insufficient documentation

## 2021-01-12 DIAGNOSIS — O09813 Supervision of pregnancy resulting from assisted reproductive technology, third trimester: Secondary | ICD-10-CM | POA: Diagnosis not present

## 2021-01-12 DIAGNOSIS — Z3A40 40 weeks gestation of pregnancy: Secondary | ICD-10-CM | POA: Diagnosis not present

## 2021-01-12 DIAGNOSIS — R197 Diarrhea, unspecified: Secondary | ICD-10-CM | POA: Diagnosis not present

## 2021-01-12 DIAGNOSIS — O99893 Other specified diseases and conditions complicating puerperium: Secondary | ICD-10-CM | POA: Diagnosis not present

## 2021-01-12 DIAGNOSIS — O09513 Supervision of elderly primigravida, third trimester: Secondary | ICD-10-CM | POA: Diagnosis not present

## 2021-01-12 DIAGNOSIS — O9081 Anemia of the puerperium: Secondary | ICD-10-CM | POA: Diagnosis not present

## 2021-01-12 LAB — SARS CORONAVIRUS 2 (TAT 6-24 HRS): SARS Coronavirus 2: NEGATIVE

## 2021-01-13 ENCOUNTER — Other Ambulatory Visit: Payer: Self-pay

## 2021-01-13 DIAGNOSIS — O09513 Supervision of elderly primigravida, third trimester: Secondary | ICD-10-CM | POA: Diagnosis not present

## 2021-01-14 ENCOUNTER — Encounter (HOSPITAL_COMMUNITY): Payer: Self-pay | Admitting: Obstetrics and Gynecology

## 2021-01-14 ENCOUNTER — Inpatient Hospital Stay (HOSPITAL_COMMUNITY): Payer: 59

## 2021-01-14 ENCOUNTER — Inpatient Hospital Stay (HOSPITAL_COMMUNITY)
Admission: AD | Admit: 2021-01-14 | Discharge: 2021-01-17 | DRG: 807 | Disposition: A | Discharge status: Home / Self Care | Payer: 59 | Attending: Obstetrics and Gynecology | Admitting: Obstetrics and Gynecology

## 2021-01-14 ENCOUNTER — Other Ambulatory Visit: Payer: Self-pay

## 2021-01-14 DIAGNOSIS — R197 Diarrhea, unspecified: Secondary | ICD-10-CM | POA: Diagnosis not present

## 2021-01-14 DIAGNOSIS — Z3A40 40 weeks gestation of pregnancy: Secondary | ICD-10-CM

## 2021-01-14 DIAGNOSIS — Z20822 Contact with and (suspected) exposure to covid-19: Secondary | ICD-10-CM | POA: Diagnosis present

## 2021-01-14 DIAGNOSIS — O9081 Anemia of the puerperium: Secondary | ICD-10-CM | POA: Diagnosis not present

## 2021-01-14 DIAGNOSIS — O26893 Other specified pregnancy related conditions, third trimester: Secondary | ICD-10-CM | POA: Diagnosis present

## 2021-01-14 DIAGNOSIS — O99893 Other specified diseases and conditions complicating puerperium: Secondary | ICD-10-CM | POA: Diagnosis not present

## 2021-01-14 DIAGNOSIS — Z3490 Encounter for supervision of normal pregnancy, unspecified, unspecified trimester: Secondary | ICD-10-CM

## 2021-01-14 LAB — CBC
HCT: 33.5 % — ABNORMAL LOW (ref 36.0–46.0)
Hemoglobin: 10.6 g/dL — ABNORMAL LOW (ref 12.0–15.0)
MCH: 25.7 pg — ABNORMAL LOW (ref 26.0–34.0)
MCHC: 31.6 g/dL (ref 30.0–36.0)
MCV: 81.1 fL (ref 80.0–100.0)
Platelets: 192 10*3/uL (ref 150–400)
RBC: 4.13 MIL/uL (ref 3.87–5.11)
RDW: 13.7 % (ref 11.5–15.5)
WBC: 8.8 10*3/uL (ref 4.0–10.5)
nRBC: 0 % (ref 0.0–0.2)

## 2021-01-14 LAB — TYPE AND SCREEN
ABO/RH(D): A POS
Antibody Screen: NEGATIVE

## 2021-01-14 LAB — RPR: RPR Ser Ql: NONREACTIVE

## 2021-01-14 MED ORDER — FENTANYL-BUPIVACAINE-NACL 0.5-0.125-0.9 MG/250ML-% EP SOLN
12.0000 mL/h | EPIDURAL | Status: DC | PRN
  Administered 2021-01-15: 12 mL/h via EPIDURAL
  Filled 2021-01-14: qty 250

## 2021-01-14 MED ORDER — EPHEDRINE 5 MG/ML INJ: 10.0000 mg | INTRAVENOUS | Status: DC | PRN

## 2021-01-14 MED ORDER — FENTANYL CITRATE (PF) 100 MCG/2ML IJ SOLN
100.0000 ug | INTRAMUSCULAR | Status: DC | PRN
  Administered 2021-01-14: 100 ug via INTRAVENOUS
  Administered 2021-01-14 (×2): 50 ug via INTRAVENOUS
  Filled 2021-01-14 (×3): qty 2

## 2021-01-14 MED ORDER — ONDANSETRON HCL 4 MG/2ML IJ SOLN
4.0000 mg | Freq: Four times a day (QID) | INTRAMUSCULAR | Status: DC | PRN
  Administered 2021-01-15 (×2): 4 mg via INTRAVENOUS
  Filled 2021-01-14 (×2): qty 2

## 2021-01-14 MED ORDER — PHENYLEPHRINE 40 MCG/ML (10ML) SYRINGE FOR IV PUSH (FOR BLOOD PRESSURE SUPPORT)
80.0000 ug | PREFILLED_SYRINGE | INTRAVENOUS | Status: DC | PRN
  Filled 2021-01-14: qty 10

## 2021-01-14 MED ORDER — LACTATED RINGERS IV SOLN: 500.0000 mL | INTRAVENOUS | Status: DC | PRN

## 2021-01-14 MED ORDER — MISOPROSTOL 25 MCG QUARTER TABLET
ORAL_TABLET | ORAL | Status: AC
  Administered 2021-01-14: 25 ug
  Filled 2021-01-14: qty 1

## 2021-01-14 MED ORDER — ACETAMINOPHEN 325 MG PO TABS: 650.0000 mg | ORAL_TABLET | ORAL | Status: DC | PRN

## 2021-01-14 MED ORDER — OXYCODONE-ACETAMINOPHEN 5-325 MG PO TABS: 2.0000 | ORAL_TABLET | ORAL | Status: DC | PRN

## 2021-01-14 MED ORDER — OXYTOCIN BOLUS FROM INFUSION
333.0000 mL | Freq: Once | INTRAVENOUS | Status: AC
  Administered 2021-01-15: 333 mL via INTRAVENOUS

## 2021-01-14 MED ORDER — MISOPROSTOL 25 MCG QUARTER TABLET
25.0000 ug | ORAL_TABLET | ORAL | Status: DC
  Administered 2021-01-14 (×3): 25 ug via VAGINAL
  Filled 2021-01-14 (×4): qty 1

## 2021-01-14 MED ORDER — DIPHENHYDRAMINE HCL 50 MG/ML IJ SOLN: 12.5000 mg | INTRAMUSCULAR | Status: DC | PRN

## 2021-01-14 MED ORDER — OXYCODONE-ACETAMINOPHEN 5-325 MG PO TABS
1.0000 | ORAL_TABLET | ORAL | Status: DC | PRN
Start: 2021-01-14 — End: 2021-01-15

## 2021-01-14 MED ORDER — SOD CITRATE-CITRIC ACID 500-334 MG/5ML PO SOLN
30.0000 mL | ORAL | Status: DC | PRN
  Administered 2021-01-15: 30 mL via ORAL
  Filled 2021-01-14: qty 15

## 2021-01-14 MED ORDER — LACTATED RINGERS IV SOLN: INTRAVENOUS | Status: DC

## 2021-01-14 MED ORDER — TERBUTALINE SULFATE 1 MG/ML IJ SOLN: 0.2500 mg | Freq: Once | INTRAMUSCULAR | Status: DC | PRN

## 2021-01-14 MED ORDER — PHENYLEPHRINE 40 MCG/ML (10ML) SYRINGE FOR IV PUSH (FOR BLOOD PRESSURE SUPPORT): 80.0000 ug | PREFILLED_SYRINGE | INTRAVENOUS | Status: DC | PRN

## 2021-01-14 MED ORDER — LIDOCAINE HCL (PF) 1 % IJ SOLN: 30.0000 mL | INTRAMUSCULAR | Status: DC | PRN

## 2021-01-14 MED ORDER — LACTATED RINGERS IV SOLN: 500.0000 mL | Freq: Once | INTRAVENOUS | Status: DC

## 2021-01-14 MED ORDER — OXYTOCIN-SODIUM CHLORIDE 30-0.9 UT/500ML-% IV SOLN
1.0000 m[IU]/min | INTRAVENOUS | Status: DC
  Administered 2021-01-14: 2 m[IU]/min via INTRAVENOUS
  Filled 2021-01-14: qty 500

## 2021-01-14 MED ORDER — OXYTOCIN-SODIUM CHLORIDE 30-0.9 UT/500ML-% IV SOLN: 2.5000 [IU]/h | INTRAVENOUS | Status: DC

## 2021-01-14 NOTE — Progress Notes (Signed)
OB Progress Note  S: Patient comfortable with mild cramping.   O: Today's Vitals   01/14/21 1603 01/14/21 1630 01/14/21 1759 01/14/21 1917  BP: 113/82  106/73 114/73  Pulse: 71  73 81  Resp: 16  17 17   Temp:      TempSrc:   Oral   Weight:      Height:      PainSc:  0-No pain 0-No pain 0-No pain   Body mass index is 22.59 kg/m.   FHR: 135bpm, moderate variability, + accels, no decels Toco: ctx irregular q4-6 mins  SVE 1/70/-2, Cook catheter placed 30/30    A/P: 24O G1P0 @ [redacted]w[redacted]d, IOL term IVF pregnancy 1. IOL: s/p cytotec x 4 overnight. Had previously declined balloon catheter, however now amenable to placement due to lack of progress over the course of the day. Cook catheter placed with 30cc intrauterine and 30cc vaginal. Will slowly add additional 30cc to both balloons with a goal of 60/60. Pitocin ordered.  2. Epidural upon patient request 3. Fetal wellbeing: cat I tracing   M. Brien Mates, MD 01/14/21 8:42 PM

## 2021-01-14 NOTE — H&P (Signed)
Debbie Harris is a 35 y.o. female G1P0 [redacted]w[redacted]d presenting for term IOL. She reports no LOF, VB. Mild Contractions. Normal FM.   Pregnancy c/b: 1. IVF pregnancy: s/p fetal echo, s/p growth [place] at 33 weeks (4#5, 59%ile. AFI 11.64) 2. AMA: low risk NIPT  OB History    Gravida  1   Para      Term      Preterm      AB      Living        SAB      IAB      Ectopic      Multiple      Live Births             Past Medical History:  Diagnosis Date  . Anxiety    Phreesia 12/08/2020  . Depression    Past Surgical History:  Procedure Laterality Date  . BREAST ENHANCEMENT SURGERY Bilateral 2018  . BREAST SURGERY N/A    Phreesia 12/08/2020  . URETER SURGERY  1992   Family History: family history includes Bladder Cancer in her father; Breast cancer in her maternal grandmother; Hyperlipidemia in her father; Hypertension in her father. Social History:  reports that she has never smoked. She has never used smokeless tobacco. She reports previous alcohol use. She reports that she does not use drugs.     Maternal Diabetes: No Genetic Screening: Normal Maternal Ultrasounds/Referrals: Normal Fetal Ultrasounds or other Referrals:  Fetal echo Maternal Substance Abuse:  No Significant Maternal Medications:  None Significant Maternal Lab Results:  None Other Comments:  None  Review of Systems Per HPI Exam Physical Exam  Dilation: Fingertip Effacement (%): Thick Station: -2 Exam by:: Chief Financial Officer Blood pressure 113/75, pulse 63, temperature 98.2 F (36.8 C), temperature source Oral, resp. rate 17, height 5\' 9"  (1.753 m), weight 69.4 kg, last menstrual period 04/09/2020. NAD, resting comfortably No labored respiration Gravid abdomen, EFW 7lb by [place] Fetal testing: FHR 135bpm, moderate variability, + accels, no decels Toco: ctx q 2 mins   Prenatal labs: ABO, Rh:  --/--/A POS (05/27 0044) Antibody: NEG (05/27 0044) Rubella: Immune (11/09 0000) RPR: Nonreactive  (11/09 0000)  HBsAg: Negative (11/09 0000)  HIV: Non-reactive (11/09 0000)  GBS:   negative  Assessment/Plan: 89Y G1P0 @ [redacted]w[redacted]d, IOL term IVF pregnancy 1. IOL: s/p cytotec x 2 overnight, continue cytotec q4hr PRN cervical ripening. Discussed possibility of foley bulb, patient declines at this time 2. Epidural upon patient request 3. Fetal wellbeing: cat I tracing   Rowland Lathe 01/14/2021, 8:31 AM

## 2021-01-15 ENCOUNTER — Inpatient Hospital Stay (HOSPITAL_COMMUNITY): Payer: 59 | Admitting: Anesthesiology

## 2021-01-15 ENCOUNTER — Encounter (HOSPITAL_COMMUNITY): Payer: Self-pay | Admitting: Obstetrics and Gynecology

## 2021-01-15 LAB — CBC
HCT: 36 % (ref 36.0–46.0)
Hemoglobin: 11.1 g/dL — ABNORMAL LOW (ref 12.0–15.0)
MCH: 25.3 pg — ABNORMAL LOW (ref 26.0–34.0)
MCHC: 30.8 g/dL (ref 30.0–36.0)
MCV: 82.2 fL (ref 80.0–100.0)
Platelets: 173 10*3/uL (ref 150–400)
RBC: 4.38 MIL/uL (ref 3.87–5.11)
RDW: 14 % (ref 11.5–15.5)
WBC: 12.5 10*3/uL — ABNORMAL HIGH (ref 4.0–10.5)
nRBC: 0 % (ref 0.0–0.2)

## 2021-01-15 MED ORDER — LIDOCAINE-EPINEPHRINE (PF) 2 %-1:200000 IJ SOLN
INTRAMUSCULAR | Status: DC | PRN
  Administered 2021-01-15: 4 mL via EPIDURAL

## 2021-01-15 MED ORDER — IBUPROFEN 600 MG PO TABS
600.0000 mg | ORAL_TABLET | Freq: Four times a day (QID) | ORAL | Status: DC
  Administered 2021-01-15: 600 mg via ORAL
  Filled 2021-01-15 (×2): qty 1

## 2021-01-15 MED ORDER — WITCH HAZEL-GLYCERIN EX PADS: 1.0000 "application " | MEDICATED_PAD | CUTANEOUS | Status: DC | PRN

## 2021-01-15 MED ORDER — PRENATAL MULTIVITAMIN CH
1.0000 | ORAL_TABLET | Freq: Every day | ORAL | Status: DC
  Filled 2021-01-15: qty 1

## 2021-01-15 MED ORDER — MISOPROSTOL 200 MCG PO TABS
ORAL_TABLET | ORAL | Status: AC
  Administered 2021-01-15: 1000 ug via RECTAL
  Filled 2021-01-15: qty 5

## 2021-01-15 MED ORDER — ONDANSETRON HCL 4 MG PO TABS: 4.0000 mg | ORAL_TABLET | ORAL | Status: DC | PRN

## 2021-01-15 MED ORDER — ONDANSETRON HCL 4 MG/2ML IJ SOLN: 4.0000 mg | INTRAMUSCULAR | Status: DC | PRN

## 2021-01-15 MED ORDER — DIPHENHYDRAMINE HCL 25 MG PO CAPS
25.0000 mg | ORAL_CAPSULE | Freq: Four times a day (QID) | ORAL | Status: DC | PRN
Start: 2021-01-15 — End: 2021-01-17

## 2021-01-15 MED ORDER — SIMETHICONE 80 MG PO CHEW: 80.0000 mg | CHEWABLE_TABLET | ORAL | Status: DC | PRN

## 2021-01-15 MED ORDER — OXYCODONE HCL 5 MG PO TABS: 10.0000 mg | ORAL_TABLET | ORAL | Status: DC | PRN

## 2021-01-15 MED ORDER — FLEET ENEMA 7-19 GM/118ML RE ENEM: 1.0000 | ENEMA | Freq: Every day | RECTAL | Status: DC | PRN

## 2021-01-15 MED ORDER — BENZOCAINE-MENTHOL 20-0.5 % EX AERO
1.0000 "application " | INHALATION_SPRAY | CUTANEOUS | Status: DC | PRN
  Administered 2021-01-16: 1 via TOPICAL
  Filled 2021-01-15: qty 56

## 2021-01-15 MED ORDER — BISACODYL 10 MG RE SUPP: 10.0000 mg | Freq: Every day | RECTAL | Status: DC | PRN

## 2021-01-15 MED ORDER — SENNOSIDES-DOCUSATE SODIUM 8.6-50 MG PO TABS
2.0000 | ORAL_TABLET | Freq: Every day | ORAL | Status: DC
  Administered 2021-01-17: 2 via ORAL
  Filled 2021-01-15 (×2): qty 2

## 2021-01-15 MED ORDER — OXYCODONE HCL 5 MG PO TABS
5.0000 mg | ORAL_TABLET | ORAL | Status: DC | PRN
Start: 2021-01-15 — End: 2021-01-17

## 2021-01-15 MED ORDER — TRANEXAMIC ACID-NACL 1000-0.7 MG/100ML-% IV SOLN
1000.0000 mg | INTRAVENOUS | Status: AC
  Administered 2021-01-15: 1000 mg via INTRAVENOUS

## 2021-01-15 MED ORDER — ACETAMINOPHEN 325 MG PO TABS: 650.0000 mg | ORAL_TABLET | ORAL | Status: DC | PRN

## 2021-01-15 MED ORDER — MISOPROSTOL 200 MCG PO TABS: 1000.0000 ug | ORAL_TABLET | Freq: Once | ORAL | Status: AC

## 2021-01-15 MED ORDER — COCONUT OIL OIL: 1.0000 "application " | TOPICAL_OIL | Status: DC | PRN

## 2021-01-15 MED ORDER — CARBOPROST TROMETHAMINE 250 MCG/ML IM SOLN: 250.0000 ug | Freq: Once | INTRAMUSCULAR | Status: AC

## 2021-01-15 MED ORDER — METHYLERGONOVINE MALEATE 0.2 MG/ML IJ SOLN
INTRAMUSCULAR | Status: AC
  Filled 2021-01-15: qty 1

## 2021-01-15 MED ORDER — TRANEXAMIC ACID-NACL 1000-0.7 MG/100ML-% IV SOLN
INTRAVENOUS | Status: AC
  Filled 2021-01-15: qty 100

## 2021-01-15 MED ORDER — DIBUCAINE (PERIANAL) 1 % EX OINT: 1.0000 "application " | TOPICAL_OINTMENT | CUTANEOUS | Status: DC | PRN

## 2021-01-15 MED ORDER — LOPERAMIDE HCL 2 MG PO CAPS
2.0000 mg | ORAL_CAPSULE | ORAL | Status: DC | PRN
  Administered 2021-01-15: 4 mg via ORAL
  Filled 2021-01-15: qty 2

## 2021-01-15 MED ORDER — CARBOPROST TROMETHAMINE 250 MCG/ML IM SOLN
INTRAMUSCULAR | Status: AC
  Administered 2021-01-15: 250 ug via INTRAMUSCULAR
  Filled 2021-01-15: qty 1

## 2021-01-15 MED ORDER — METHYLERGONOVINE MALEATE 0.2 MG/ML IJ SOLN
0.2000 mg | Freq: Once | INTRAMUSCULAR | Status: AC
  Administered 2021-01-15: 0.2 mg via INTRAMUSCULAR

## 2021-01-15 MED ORDER — KETOROLAC TROMETHAMINE 30 MG/ML IJ SOLN
15.0000 mg | Freq: Once | INTRAMUSCULAR | Status: AC
  Administered 2021-01-15: 15 mg via INTRAVENOUS
  Filled 2021-01-15 (×2): qty 1

## 2021-01-15 NOTE — Progress Notes (Signed)
OB Progress Note  Cook catheter fell out around 0600 this morning, followed by SROM. Patient received epidural and progressed from 3.5 to 6 to 10cm at 1100. Wasn't feeling pressure so labored down and started pushing at 1200. Pushing effectively, anticipate NSVD. Reassuring FHT.  Luther Redo, MD 01/15/21 12:58 PM

## 2021-01-15 NOTE — Anesthesia Preprocedure Evaluation (Signed)
Anesthesia Evaluation  Patient identified by MRN, date of birth, ID band Patient awake    Reviewed: Allergy & Precautions, NPO status , Patient's Chart, lab work & pertinent test results  Airway Mallampati: II  TM Distance: >3 FB Neck ROM: Full    Dental no notable dental hx.    Pulmonary neg pulmonary ROS,    Pulmonary exam normal breath sounds clear to auscultation       Cardiovascular negative cardio ROS Normal cardiovascular exam Rhythm:Regular Rate:Normal     Neuro/Psych PSYCHIATRIC DISORDERS Anxiety Depression negative neurological ROS     GI/Hepatic negative GI ROS, Neg liver ROS,   Endo/Other  negative endocrine ROS  Renal/GU negative Renal ROS  negative genitourinary   Musculoskeletal negative musculoskeletal ROS (+)   Abdominal   Peds  Hematology negative hematology ROS (+)   Anesthesia Other Findings IOL at term   Reproductive/Obstetrics (+) Pregnancy                             Anesthesia Physical Anesthesia Plan  ASA: II  Anesthesia Plan: Epidural   Post-op Pain Management:    Induction:   PONV Risk Score and Plan: Treatment may vary due to age or medical condition  Airway Management Planned: Natural Airway  Additional Equipment:   Intra-op Plan:   Post-operative Plan:   Informed Consent: I have reviewed the patients History and Physical, chart, labs and discussed the procedure including the risks, benefits and alternatives for the proposed anesthesia with the patient or authorized representative who has indicated his/her understanding and acceptance.       Plan Discussed with: Anesthesiologist  Anesthesia Plan Comments: (Patient identified. Risks, benefits, options discussed with patient including but not limited to bleeding, infection, nerve damage, paralysis, failed block, incomplete pain control, headache, blood pressure changes, nausea, vomiting,  reactions to medication, itching, and post partum back pain. Confirmed with bedside nurse the patient's most recent platelet count. Confirmed with the patient that they are not taking any anticoagulation, have any bleeding history or any family history of bleeding disorders. Patient expressed understanding and wishes to proceed. All questions were answered. )        Anesthesia Quick Evaluation

## 2021-01-15 NOTE — Anesthesia Procedure Notes (Signed)
Epidural Patient location during procedure: OB Start time: 01/15/2021 7:25 AM End time: 01/15/2021 7:35 AM  Staffing Anesthesiologist: Freddrick March, MD Performed: anesthesiologist   Preanesthetic Checklist Completed: patient identified, IV checked, risks and benefits discussed, monitors and equipment checked, pre-op evaluation and timeout performed  Epidural Patient position: sitting Prep: DuraPrep and site prepped and draped Patient monitoring: continuous pulse ox, blood pressure, heart rate and cardiac monitor Approach: midline Location: L3-L4 Injection technique: LOR air  Needle:  Needle type: Tuohy  Needle gauge: 17 G Needle length: 9 cm Needle insertion depth: 4 cm Catheter type: closed end flexible Catheter size: 19 Gauge Catheter at skin depth: 10 cm Test dose: negative  Assessment Sensory level: T8 Events: blood not aspirated, injection not painful, no injection resistance, no paresthesia and negative IV test  Additional Notes Patient identified. Risks/Benefits/Options discussed with patient including but not limited to bleeding, infection, nerve damage, paralysis, failed block, incomplete pain control, headache, blood pressure changes, nausea, vomiting, reactions to medication both or allergic, itching and postpartum back pain. Confirmed with bedside nurse the patient's most recent platelet count. Confirmed with patient that they are not currently taking any anticoagulation, have any bleeding history or any family history of bleeding disorders. Patient expressed understanding and wished to proceed. All questions were answered. Sterile technique was used throughout the entire procedure. Please see nursing notes for vital signs. Test dose was given through epidural catheter and negative prior to continuing to dose epidural or start infusion. Warning signs of high block given to the patient including shortness of breath, tingling/numbness in hands, complete motor block,  or any concerning symptoms with instructions to call for help. Patient was given instructions on fall risk and not to get out of bed. All questions and concerns addressed with instructions to call with any issues or inadequate analgesia.  Reason for block:procedure for pain

## 2021-01-15 NOTE — Lactation Note (Signed)
This note was copied from a baby's chart. Lactation Consultation Note  Patient Name: Debbie Harris [id number] Date: 01/15/2021   Age:37 hours  Attempted to visit with mom of 58 hour old FT female but L&D RN reported that mom has been bleeding and they're treating her. RN advised LC to try to visit with mom again once [facility]'s stabled in the [facility]. LC to complete initial assessment later tonight.  Maternal Data    Feeding    LATCH Score                    Lactation Tools Discussed/Used    Interventions    Discharge    Consult Status      [name] [name] 01/15/2021, 4:29 PM

## 2021-01-16 ENCOUNTER — Encounter (HOSPITAL_COMMUNITY): Payer: Self-pay | Admitting: Obstetrics and Gynecology

## 2021-01-16 LAB — CBC
HCT: 27.7 % — ABNORMAL LOW (ref 36.0–46.0)
Hemoglobin: 8.8 g/dL — ABNORMAL LOW (ref 12.0–15.0)
MCH: 25.7 pg — ABNORMAL LOW (ref 26.0–34.0)
MCHC: 31.8 g/dL (ref 30.0–36.0)
MCV: 80.8 fL (ref 80.0–100.0)
Platelets: 148 10*3/uL — ABNORMAL LOW (ref 150–400)
RBC: 3.43 MIL/uL — ABNORMAL LOW (ref 3.87–5.11)
RDW: 14.1 % (ref 11.5–15.5)
WBC: 23.3 10*3/uL — ABNORMAL HIGH (ref 4.0–10.5)
nRBC: 0 % (ref 0.0–0.2)

## 2021-01-16 MED ORDER — LOPERAMIDE HCL 2 MG PO CAPS: 2.0000 mg | ORAL_CAPSULE | ORAL | Status: DC | PRN

## 2021-01-16 MED ORDER — FERROUS SULFATE 325 (65 FE) MG PO TABS
325.0000 mg | ORAL_TABLET | Freq: Every day | ORAL | Status: DC
  Administered 2021-01-16 – 2021-01-17 (×2): 325 mg via ORAL
  Filled 2021-01-16 (×2): qty 1

## 2021-01-16 MED ORDER — IBUPROFEN 100 MG/5ML PO SUSP
600.0000 mg | Freq: Four times a day (QID) | ORAL | Status: DC
  Administered 2021-01-16 – 2021-01-17 (×4): 600 mg via ORAL
  Filled 2021-01-16 (×5): qty 30

## 2021-01-16 NOTE — Progress Notes (Signed)
Mom declines lactation. Changed to formula feeding after delivery

## 2021-01-16 NOTE — Progress Notes (Signed)
CSW met with MOB to complete consult for history of anxiety and depression. CSW observed MOB resting in bed, infant in bassinet, and FOB on couch. MOB gave CSW verbal consent to complete consult while FOB was present. CSW explained role and reason for consult. MOB was pleasant, polite, and engaged with CSW. MOB reported, history of anxiety and depression from 2010. MOB reported, prior to pregnancy she was on Lexapro. MOB reported, since then she has been able to manage symptoms without any medication. MOB reported, if symptoms begin to be unmanageable, she plan to resume Lexapro. CSW provided MOB with resources supporting maternal mental health from conception through the postpartum period.   CSW provided education regarding the baby blues period vs. perinatal mood disorders, discussed treatment and gave resources for mental health follow up if concerns arise. CSW recommends self- evaluation during the postpartum time period using the New Mom Checklist from Postpartum Progress and encouraged MOB to contact a medical professional if symptoms are noted at any time.   When CSW asked MOB of her emotions since delivery. MOB reported, she feels, "great" . MOB reported, FOB, mother, and sister are her supports. MOB denied SI, and HI when CSW assessed for safety.   MOB reported, there are no barriers to follow up infant's care. MOB reported, she has all essentials needed to care for infant. MOB reported, infant has a car seat, crib, bassinet, and pack & play. MOB denied any additional barriers.     CSW provided education on sudden infant death syndrome (SIDS).    CSW identifies no further need for intervention or barriers to discharge at this time.   Darcus Austin, MSW, LCSW-A Clinical Social Worker [phone]

## 2021-01-16 NOTE — Progress Notes (Signed)
Post Partum Day 1 Subjective: Josalynn reports feeling sore but overall doing well. Ambulating to bathroom without lightheadedness or dizziness. Lochia appropriate. Having some loose stools. No fevers/chills. Formula feeing. No chest pain, shortness of breath.   Objective: Patient Vitals for the past 24 hrs:  BP Temp Temp src Pulse Resp SpO2  01/16/21 0600 101/66 98 F (36.7 C) Oral 71 18 100 %  01/16/21 0240 99/67 98 F (36.7 C) Oral 68 18 100 %  01/15/21 2300 118/75 97.8 F (36.6 C) Oral 73 18 100 %  01/15/21 1829 112/80 98.9 F (37.2 C) -- 77 18 95 %  01/15/21 1730 111/77 98.6 F (37 C) Oral 79 18 100 %  01/15/21 1720 -- -- -- -- -- 94 %  01/15/21 1716 109/70 98.8 F (37.1 C) Oral 85 16 --  01/15/21 1715 -- -- -- -- -- 94 %  01/15/21 1703 116/70 -- -- 91 -- --  01/15/21 1702 115/62 -- -- 94 -- --  01/15/21 1546 117/89 -- -- 88 -- --  01/15/21 1531 119/90 -- -- (!) 103 -- --  01/15/21 1431 109/74 -- -- 69 -- --  01/15/21 1401 109/67 -- -- 63 17 --  01/15/21 1331 110/68 99.2 F (37.3 C) Oral 61 -- --  01/15/21 1301 109/68 -- -- (!) 59 -- --  01/15/21 1131 100/74 -- -- 68 -- --  01/15/21 1101 (!) 103/51 98.4 F (36.9 C) Axillary 81 18 --  01/15/21 1031 (!) 97/56 -- -- 71 -- --  01/15/21 1001 107/72 -- -- 72 -- --    Physical Exam:  General: alert, cooperative and no distress Lochia: appropriate Uterine Fundus: firm DVT Evaluation: No evidence of DVT seen on physical exam.  Recent Labs    01/14/21 0050 01/15/21 1625 01/16/21 0426  WBC 8.8 12.5* 23.3*  HGB 10.6* 11.1* 8.8*  HCT 33.5* 36.0 27.7*  PLT 192 173 148*    No results for input(s): NA, K, CL, CO2CT, BUN, CREATININE, GLUCOSE, BILITOT, ALT, AST, ALKPHOS, PROT, ALBUMIN in the last 72 hours.  No results for input(s): CALCIUM, MG, PHOS in the last 72 hours.  No results for input(s): PROTIME, APTT, INR in the last 72 hours.  No results for input(s): PROTIME, APTT, INR, FIBRINOGEN in the last 72  hours. Assessment/Plan: Brigida Scotti 35 y.o. G1P1001 PPD#1 sp SVD 1. PPC: continue routine postpartum care 2. Anemia: s/p abnormal postpartum blood loss (8106mL) treated with cytotec, methergine, TXA, hemabate. She is asymptomatic. Start PO iron.  3. Diarrhea: likely s/p hemabate, continue to monitor. Imodium PRN 4. Rh positive, rubella immune, s/p tdap/covid vaccines prenatally 5. Dispo: anticipate discharge home tomorrow   LOS: 2 days   Rowland Lathe 01/16/2021, 10:00 AM

## 2021-01-16 NOTE — Anesthesia Postprocedure Evaluation (Signed)
Anesthesia Post Note  Patient: Debbie Harris  Procedure(s) Performed: AN AD [place]     Patient location during evaluation: Mother Baby Anesthesia Type: Epidural Level of consciousness: awake and alert, oriented and patient cooperative Pain management: pain level controlled Vital Signs Assessment: post-procedure vital signs reviewed and stable Respiratory status: spontaneous breathing Cardiovascular status: stable Postop Assessment: no headache, epidural receding, patient able to bend at knees and no signs of nausea or vomiting Anesthetic complications: no Comments: Pt. States she is walking. Pain score 1.    No complications documented.  Last Vitals:  Vitals:   01/16/21 0240 01/16/21 0600  BP: 99/67 101/66  Pulse: 68 71  Resp: 18 18  Temp: 36.7 C 36.7 C  SpO2: 100% 100%    Last Pain:  Vitals:   01/16/21 0600  TempSrc: Oral  PainSc: 0-No pain   Pain Goal: Patients Stated Pain Goal: 1 (01/15/21 1829)                 Rico Sheehan

## 2021-01-17 NOTE — Discharge Summary (Signed)
Postpartum Discharge Summary  Date of Service updated 01/17/21     Patient Name: Debbie Harris DOB: January 31, 1986 MRN: [id number]  Date of admission: 01/14/2021 Delivery date:01/15/2021  Delivering provider: Irene Pap E  Date of discharge: 01/17/2021  Admitting diagnosis: Pregnant and not yet delivered [Z34.90] Intrauterine pregnancy: [redacted]w[redacted]d    Secondary diagnosis:  Active Problems:   Pregnant and not yet delivered  Additional problems: Advanced maternal age    Discharge diagnosis: Term Pregnancy Delivered                                              Post partum procedures:n/a Augmentation: AROM, Pitocin, Cytotec and OP Foley Complications: None  Hospital course: Induction of Labor With Vaginal Delivery   35y.o. yo G1P1001 at 411w1das admitted to the hospital 01/14/2021 for induction of labor.  Indication for induction: AMA.  Patient had an uncomplicated labor course as follows: Membrane Rupture Time/Date: 6:42 AM ,01/15/2021   Delivery Method:Vaginal, Spontaneous  Episiotomy: None  Lacerations:   2nd degree Details of delivery can be found in separate delivery note. She did have some uterine atony of the lower uterine segment that required treatment with cytotec, hemabate, methergine and TXA.  EBL was 80049mso did not meet criteria for PP hemorrhage.  She has a mild ABLA at the time of discharge of 8.8.  Patient had a routine postpartum course. Patient is discharged home 01/17/21.  Newborn Data: Birth date:01/15/2021  Birth time:3:27 PM  Gender:Female  Living status:Living  Apgars:9 ,9  Weight:3586 g    Physical exam  Vitals:   01/16/21 0600 01/16/21 1545 01/16/21 2045 01/17/21 0509  BP: 101/66 91/64 106/79 98/67  Pulse: 71 68 67 66  Resp: 18 18 18 18   Temp: 98 F (36.7 C) 98.1 F (36.7 C) 97.8 F (36.6 C) 97.9 F (36.6 C)  TempSrc: Oral Oral Oral Oral  SpO2: 100% 97% 100% 100%  Weight:      Height:       General: alert, cooperative and no  distress Lochia: appropriate Uterine Fundus: firm Incision: N/A DVT Evaluation: No evidence of DVT seen on physical exam. Labs: Lab Results  Component Value Date   WBC 23.3 (H) 01/16/2021   HGB 8.8 (L) 01/16/2021   HCT 27.7 (L) 01/16/2021   MCV 80.8 01/16/2021   PLT 148 (L) 01/16/2021   CMP Latest Ref Rng & Units 10/07/2020  Glucose 70 - 99 mg/dL 80  BUN 6 - 20 mg/dL 10  Creatinine 0.44 - 1.00 mg/dL 0.82  Sodium 135 - 145 mmol/L 137  Potassium 3.5 - 5.1 mmol/L 3.7  Chloride 98 - 111 mmol/L 107  CO2 22 - 32 mmol/L 18(L)  Calcium 8.9 - 10.3 mg/dL 8.9  Total Protein 6.5 - 8.1 g/dL 6.3(L)  Total Bilirubin 0.3 - 1.2 mg/dL 0.7  Alkaline Phos 38 - 126 U/L 58  AST 15 - 41 U/L 30  ALT 0 - 44 U/L 32   Edinburgh Score: Edinburgh Postnatal Depression Scale Screening Tool 01/16/2021  I have been able to laugh and see the funny side of things. 0  I have looked forward with enjoyment to things. 0  I have blamed myself unnecessarily when things went wrong. 1  I have been anxious or worried for no good reason. 1  I have felt scared or panicky for no good  reason. 1  Things have been getting on top of me. 1  I have been so unhappy that I have had difficulty sleeping. 0  I have felt sad or miserable. 0  I have been so unhappy that I have been crying. 0  The thought of harming myself has occurred to me. 0  Edinburgh Postnatal Depression Scale Total 4      After visit meds:  Allergies as of 01/17/2021      Reactions   Onion    Red onion   Sulfa Antibiotics       Medication List    STOP taking these medications   COQ-10 PO   Endometrin 100 MG vaginal insert Generic drug: progesterone   oxyCODONE-acetaminophen 5-325 MG tablet Commonly known as: PERCOCET/ROXICET   progesterone 200 MG capsule Commonly known as: PROMETRIUM   promethazine 12.5 MG tablet Commonly known as: PHENERGAN     TAKE these medications   lidocaine-hydrocortisone 3-1 % Kit Commonly known as:  ANAMANTLE Insert 1 applicatorful twice a day by rectal route for 7 days.   prenatal multivitamin Tabs tablet Take 1 tablet by mouth daily at 12 noon.        Discharge home in stable condition Infant Feeding: Bottle Infant Disposition:home with mother Discharge instruction: per After Visit Summary and Postpartum booklet. Activity: Advance as tolerated. Pelvic rest for 6 weeks.  Diet: routine diet Postpartum Appointment:4 weeks  Future Appointments:No future appointments. Follow up Visit:  Follow-up Information    Rowland Lathe, MD Follow up in 4 week(s).   Specialty: Obstetrics and Gynecology Contact information: [address] [place] [address] [phone]                   01/17/2021 [facility] Lars Masson, MD

## 2021-01-17 NOTE — Progress Notes (Signed)
Patient is doing well.  She is ambulating, voiding, tolerating PO.  Pain control is good.  Lochia is appropriate  Vitals:   01/16/21 0600 01/16/21 1545 01/16/21 2045 01/17/21 0509  BP: 101/66 91/64 106/79 98/67  Pulse: 71 68 67 66  Resp: 18 18 18 18   Temp: 98 F (36.7 C) 98.1 F (36.7 C) 97.8 F (36.6 C) 97.9 F (36.6 C)  TempSrc: Oral Oral Oral Oral  SpO2: 100% 97% 100% 100%  Weight:      Height:        NAD Fundus firm Ext: trace edema bilaterally  Lab Results  Component Value Date   WBC 23.3 (H) 01/16/2021   HGB 8.8 (L) 01/16/2021   HCT 27.7 (L) 01/16/2021   MCV 80.8 01/16/2021   PLT 148 (L) 01/16/2021    --/--/A POS (05/27 0044)/RImmune  A/P 35 y.o. G1P1001 PPD#2 s/p TSVD. Routine care.   Meeting all goals.  Discharge to home today.   Romeo

## 2021-04-05 ENCOUNTER — Encounter: Payer: Self-pay | Admitting: Family Medicine

## 2021-04-05 DIAGNOSIS — F418 Other specified anxiety disorders: Secondary | ICD-10-CM

## 2021-04-06 MED ORDER — ESCITALOPRAM OXALATE 10 MG PO TABS: 10.0000 mg | ORAL_TABLET | Freq: Every day | ORAL | 1 refills | Status: DC

## 2021-04-11 ENCOUNTER — Encounter: Payer: Self-pay | Admitting: Family Medicine

## 2021-06-01 ENCOUNTER — Ambulatory Visit: Payer: 59 | Admitting: Family Medicine

## 2021-06-02 ENCOUNTER — Other Ambulatory Visit (HOSPITAL_BASED_OUTPATIENT_CLINIC_OR_DEPARTMENT_OTHER): Payer: Self-pay

## 2021-06-02 ENCOUNTER — Other Ambulatory Visit: Payer: Self-pay | Admitting: Family Medicine

## 2021-06-02 DIAGNOSIS — F418 Other specified anxiety disorders: Secondary | ICD-10-CM

## 2021-06-02 MED ORDER — ESCITALOPRAM OXALATE 10 MG PO TABS
10.0000 mg | ORAL_TABLET | Freq: Every day | ORAL | 1 refills | Status: DC
  Filled 2021-06-02: qty 90, 90d supply, fill #0
  Filled 2021-09-22: qty 30, 30d supply, fill #1

## 2021-06-06 ENCOUNTER — Other Ambulatory Visit: Payer: Self-pay | Admitting: Internal Medicine

## 2021-06-06 ENCOUNTER — Other Ambulatory Visit: Payer: Self-pay

## 2021-06-06 ENCOUNTER — Telehealth (INDEPENDENT_AMBULATORY_CARE_PROVIDER_SITE_OTHER): Payer: 59 | Admitting: Family Medicine

## 2021-06-06 ENCOUNTER — Encounter: Payer: Self-pay | Admitting: Family Medicine

## 2021-06-06 DIAGNOSIS — J40 Bronchitis, not specified as acute or chronic: Secondary | ICD-10-CM | POA: Diagnosis not present

## 2021-06-06 MED ORDER — AZITHROMYCIN 250 MG PO TABS: ORAL_TABLET | ORAL | 0 refills | Status: DC

## 2021-06-06 MED ORDER — PREDNISONE 10 MG PO TABS: ORAL_TABLET | ORAL | 0 refills | Status: DC

## 2021-06-06 MED ORDER — AZITHROMYCIN 250 MG PO TABS
ORAL_TABLET | ORAL | 0 refills | Status: DC
Start: 2021-06-06 — End: 2021-10-09

## 2021-06-06 NOTE — Progress Notes (Signed)
On call -   Per call center: Pt stated Rx's from today (Zpack, Prednisone) were not sent to her pharmacy. I re-sent her Rx's to [facility].

## 2021-06-06 NOTE — Assessment & Plan Note (Signed)
Sick x 2 weeks z pak pred taper Can con't dayquil and nyquil She is not breast feeding  rto prn

## 2021-06-06 NOTE — Progress Notes (Signed)
MyChart Video Visit    Virtual Visit via Video Note   This visit type was conducted due to national recommendations for restrictions regarding the COVID-19 Pandemic (e.g. social distancing) in an effort to limit this patient's exposure and mitigate transmission in our community. This patient is at least at moderate risk for complications without adequate follow up. This format is felt to be most appropriate for this patient at this time. Physical exam was limited by quality of the video and audio technology used for the visit. Alinda Dooms was able to get the patient set up on a video visit.  Patient location: home alone with 40 month old  Patient and provider in visit Provider location: Office  I discussed the limitations of evaluation and management by telemedicine and the availability of in person appointments. The patient expressed understanding and agreed to proceed.  Visit Date: 06/06/2021  Today's healthcare provider: Ann Held, DO     Subjective:    Patient ID: Debbie Harris, female    DOB: Dec 10, 1985, 35 y.o.   MRN: [id number]  No chief complaint on file.   HPI Patient is in today for congestion, cough , productive and sob  + chills   no fever    she is taking dayquil and nyquil and humidifier  X few weeks    her 8 month old had rsv and her husband was sick as well.   They are improving.   Pt had a neg home covid test today.    Past Medical History:  Diagnosis Date   Anxiety    [employer]reesia 12/08/2020   Depression     Past Surgical History:  Procedure Laterality Date   BREAST ENHANCEMENT SURGERY Bilateral 2018   BREAST SURGERY N/A    [employer]reesia 12/08/2020   URETER SURGERY  1992    Family History  Problem Relation Age of Onset   Hypertension Father    Hyperlipidemia Father    Bladder Cancer Father    Breast cancer Maternal Grandmother     Social History   Socioeconomic History   Marital status: Married    Spouse name: Not on file    Number of children: Not on file   Years of education: Not on file   Highest education level: Not on file  Occupational History   Not on file  Tobacco Use   Smoking status: Never   Smokeless tobacco: Never  Vaping Use   Vaping Use: Never used  Substance and Sexual Activity   Alcohol use: Not Currently    Comment: social   Drug use: No   Sexual activity: Yes  Other Topics Concern   Not on file  Social History Narrative   Not on file   Social Determinants of Health   Financial Resource Strain: Not on file  Food Insecurity: Not on file  Transportation Needs: Not on file  Physical Activity: Not on file  Stress: Not on file  Social Connections: Not on file  Intimate Partner Violence: Not on file    Outpatient Medications Prior to Visit  Medication Sig Dispense Refill   escitalopram (LEXAPRO) 10 MG tablet Take 1 tablet (10 mg total) by mouth daily. 90 tablet 1   escitalopram (LEXAPRO) 10 MG tablet Take 1 tablet (10 mg total) by mouth daily. 90 tablet 1   Prenatal Vit-Fe Fumarate-FA (PRENATAL MULTIVITAMIN) TABS tablet Take 1 tablet by mouth daily at 12 noon.     No facility-administered medications prior to visit.  Allergies  Allergen Reactions   Onion     Red onion   Sulfa Antibiotics     Review of Systems  Constitutional:  Positive for chills and malaise/fatigue. Negative for fever.  HENT:  Positive for congestion. Negative for ear discharge, ear pain and sinus pain.   Respiratory:  Positive for cough, sputum production and shortness of breath. Negative for wheezing.   Musculoskeletal:  Positive for myalgias.      Objective:    Physical Exam Vitals and nursing note reviewed.  Constitutional:      Appearance: She is ill-appearing.  Pulmonary:     Effort: Pulmonary effort is normal.  Neurological:     Mental Status: She is alert.  Psychiatric:        Mood and Affect: Mood normal.        Behavior: Behavior normal.    There were no vitals taken for this  visit. Wt Readings from Last 3 Encounters:  01/14/21 153 lb (69.4 kg)  10/07/20 143 lb 4.8 oz (65 kg)  02/02/20 127 lb (57.6 kg)    Diabetic Foot Exam - Simple   No data filed    Lab Results  Component Value Date   WBC 23.3 (H) 01/16/2021   HGB 8.8 (L) 01/16/2021   HCT 27.7 (L) 01/16/2021   PLT 148 (L) 01/16/2021   GLUCOSE 80 10/07/2020   CHOL 154 01/30/2019   TRIG 50.0 01/30/2019   HDL 64.70 01/30/2019   LDLCALC 80 01/30/2019   ALT 32 10/07/2020   AST 30 10/07/2020   NA 137 10/07/2020   K 3.7 10/07/2020   CL 107 10/07/2020   CREATININE 0.82 10/07/2020   BUN 10 10/07/2020   CO2 18 (L) 10/07/2020   HGBA1C 5.2 01/30/2019    No results found for: TSH Lab Results  Component Value Date   WBC 23.3 (H) 01/16/2021   HGB 8.8 (L) 01/16/2021   HCT 27.7 (L) 01/16/2021   MCV 80.8 01/16/2021   PLT 148 (L) 01/16/2021   Lab Results  Component Value Date   NA 137 10/07/2020   K 3.7 10/07/2020   CO2 18 (L) 10/07/2020   GLUCOSE 80 10/07/2020   BUN 10 10/07/2020   CREATININE 0.82 10/07/2020   BILITOT 0.7 10/07/2020   ALKPHOS 58 10/07/2020   AST 30 10/07/2020   ALT 32 10/07/2020   PROT 6.3 (L) 10/07/2020   ALBUMIN 3.2 (L) 10/07/2020   CALCIUM 8.9 10/07/2020   ANIONGAP 12 10/07/2020   GFR 75.32 01/30/2019   Lab Results  Component Value Date   CHOL 154 01/30/2019   Lab Results  Component Value Date   HDL 64.70 01/30/2019   Lab Results  Component Value Date   LDLCALC 80 01/30/2019   Lab Results  Component Value Date   TRIG 50.0 01/30/2019   Lab Results  Component Value Date   CHOLHDL 2 01/30/2019   Lab Results  Component Value Date   HGBA1C 5.2 01/30/2019       Assessment & Plan:   Problem List Items Addressed This Visit       Unprioritized   Bronchitis - Primary    Sick x 2 weeks z pak pred taper Can con't dayquil and nyquil She is not breast feeding  rto prn       Relevant Medications   azithromycin (ZITHROMAX Z-PAK) 250 MG tablet    predniSONE (DELTASONE) 10 MG tablet    I am having Debbie Harris start on azithromycin and predniSONE. I am  also having her maintain her prenatal multivitamin, escitalopram, and escitalopram.  Meds ordered this encounter  Medications   azithromycin (ZITHROMAX Z-PAK) 250 MG tablet    Sig: As directed    Dispense:  6 each    Refill:  0   predniSONE (DELTASONE) 10 MG tablet    Sig: TAKE 3 TABLETS PO QD FOR 3 DAYS THEN TAKE 2 TABLETS PO QD FOR 3 DAYS THEN TAKE 1 TABLET PO QD FOR 3 DAYS THEN TAKE 1/2 TAB PO QD FOR 3 DAYS    Dispense:  20 tablet    Refill:  0    I discussed the assessment and treatment plan with the patient. The patient was provided an opportunity to ask questions and all were answered. The patient agreed with the plan and demonstrated an understanding of the instructions.   The patient was advised to call back or seek an in-person evaluation if the symptoms worsen or if the condition fails to improve as anticipated.    Ann Held, DO [place] [facility] [phone] (phone) [phone] (fax)  [place]

## 2021-06-06 NOTE — Patient Instructions (Signed)
Acute Bronchitis, Adult Acute bronchitis is sudden or acute swelling of the air tubes (bronchi) in the lungs. Acute bronchitis causes these tubes to fill with mucus, which can make it hard to breathe. It can also cause coughing or wheezing. In adults, acute bronchitis usually goes away within 2 weeks. A cough caused by bronchitis may last up to 3 weeks. Smoking, allergies, and asthma can make the condition worse. What are the causes? This condition can be caused by germs and by substances that irritate the lungs, including: Cold and flu viruses. The most common cause of this condition is the virus that causes the common cold. Bacteria. Substances that irritate the lungs, including: Smoke from cigarettes and other forms of tobacco. Dust and pollen. Fumes from chemical products, gases, or burned fuel. Other materials that pollute indoor or outdoor air. Close contact with someone who has acute bronchitis. What increases the risk? The following factors may make you more likely to develop this condition: A weak body's defense system, also called the immune system. A condition that affects your lungs and breathing, such as asthma. What are the signs or symptoms? Common symptoms of this condition include: Lung and breathing problems, such as: Coughing. This may bring up clear, yellow, or green mucus from your lungs (sputum). Wheezing. Having too much mucus in your lungs (chest congestion). Having shortness of breath. A fever. Chills. Aches and pains, including: Tightness in your chest and other body aches. A sore throat. How is this diagnosed? This condition is usually diagnosed based on: Your symptoms and medical history. A physical exam. You may also have other tests, including tests to rule out other conditions, such as pneumonia. These tests include: A test of lung function. Test of a mucus sample to look for the presence of bacteria. Tests to check the oxygen level in your  blood. Blood tests. Chest X-ray. How is this treated? Most cases of acute bronchitis clear up over time without treatment. Your health care provider may recommend: Drinking more fluids. This can thin your mucus, which may improve your breathing. Using a device that gets medicine into your lungs (inhaler) to help improve breathing and control coughing. Using a vaporizer or a humidifier. These are machines that add water to the air to help you breathe better. Taking a medicine for a fever. Taking a medicine that thins mucus and clears congestion (expectorant). Taking a medicine that prevents or stops coughing (cough suppressant). Follow these instructions at home: Activity Get plenty of rest. Return to your normal activities as told by your health care provider. Ask your health care provider what activities are safe for you. Lifestyle  Drink enough fluid to keep your urine pale yellow. Do not drink alcohol. Do not use any products that contain nicotine or tobacco, such as cigarettes, e-cigarettes, and chewing tobacco. If you need help quitting, ask your health care provider. Be aware that: Your bronchitis will get worse if you smoke or breathe in other people's smoke (secondhand smoke). Your lungs will heal faster if you quit smoking. General instructions Take over-the-counter and prescription medicines only as told by your health care provider. Use an inhaler, vaporizer, or humidifier as told by your health care provider. If you have a sore throat, gargle with a salt-water mixture 3-4 times a day or as needed. To make a salt-water mixture, completely dissolve -1 tsp (3-6 g) of salt in 1 cup (237 mL) of warm water. Take two teaspoons of honey at bedtime to lessen coughing at night.  Keep all follow-up visits as told by your health care provider. This is important. How is this prevented? To lower your risk of getting this condition again: Wash your hands often with soap and water. If soap  and water are not available, use hand sanitizer. Avoid contact with people who have cold symptoms. Try not to touch your mouth, nose, or eyes with your hands. Avoid places where there are fumes from chemicals. Breathing these fumes will make your condition worse. Get the flu shot every year. Contact a health care provider if: Your symptoms do not improve after 2 weeks of treatment. You vomit more than once or twice. You have symptoms of dehydration such as: Dark urine. Dry skin or eyes. Increased thirst. Headaches. Confusion. Muscle cramps. Get help right away if you: Cough up blood. Feel pain in your chest. Have severe shortness of breath. Faint or keep feeling like you are going to faint. Have a severe headache. Have fever or chills that get worse. These symptoms may represent a serious problem that is an emergency. Do not wait to see if the symptoms will go away. Get medical help right away. Call your local emergency services (911 in the [place] Do not drive yourself to the hospital. Summary Acute bronchitis is sudden (acute) inflammation of the air tubes (bronchi) between the windpipe and the lungs. In adults, acute bronchitis usually goes away within 2 weeks, although coughing may last 3 weeks or longer. Take over-the-counter and prescription medicines only as told by your health care provider. Drink enough fluid to keep your urine pale yellow. Contact a health care provider if your symptoms do not improve after 2 weeks of treatment. Get help right away if you cough up blood, faint, or have chest pain or shortness of breath. This information is not intended to replace advice given to you by your health care provider. Make sure you discuss any questions you have with your health care provider. Document Revised: 07/07/2020 Document Reviewed: 02/28/2019 Elsevier Patient Education  [place].

## 2021-06-07 ENCOUNTER — Telehealth: Payer: Self-pay

## 2021-06-07 NOTE — Telephone Encounter (Signed)
Nurse Assessment Nurse: Lavina Hamman, RN, Thomasena Edis Date/Time (Eastern Time): 06/06/2021 6:53:54 PM Confirm and document reason for call. If symptomatic, describe symptoms. ---Caller states prednisone and z pack not at pharmacy. Diagnosed with bronchitis via virtual visit today. Shortness of breath, cough and fatigue Does the patient have any new or worsening symptoms? ---Yes Will a triage be completed? ---Yes Related visit to physician within the last 2 weeks? ---Yes Does the PT have any chronic conditions? (i.e. diabetes, asthma, this includes High risk factors for pregnancy, etc.) ---No Is the patient pregnant or possibly pregnant? (Ask all females between the ages of 51-55) ---No Is this a behavioral health or substance abuse call? ---No Guidelines Guideline Title Affirmed Question Affirmed Notes Nurse Date/Time Eilene Ghazi Time) Recent Medical Visit for Illness Follow-up Call [1] Caller has URGENT question (includes prescribed medication questions) AND [2] triager unable to answer question Lavina Hamman, RN, Thomasena Edis 06/06/2021 6:55:10 PM PLEASE NOTE: All timestamps contained within this report are represented as [place] Standard Time. CONFIDENTIALTY NOTICE: This fax transmission is intended only for the addressee. It contains information that is legally privileged, confidential or otherwise protected from use or disclosure. If you are not the intended recipient, you are strictly prohibited from reviewing, disclosing, copying using or disseminating any of this information or taking any action in reliance on or regarding this information. If you have received this fax in error, please notify us immediately by telephone so that we can arrange for its return to [place]. Phone: [phone], Toll-Free: [phone], Fax: [phone] Page: 2 of 2 Call Id: [id number] March [facility]. Time Eilene Ghazi Time) Disposition Final User 06/06/2021 7:02:21 PM Called On-Call Provider Lavina Hamman, RN, Thomasena Edis 06/06/2021 6:57:01 PM Call  PCP Now Velta Addison Lavina Hamman, RN, Juline Patch Disagree/Comply Comply Caller Understands Yes PreDisposition InappropriateToAsk Care Advice Given Per Guideline * You need to discuss this with your doctor (or NP/PA). CALL PCP NOW: CALL BACK IF: * You become worse CARE ADVICE given per Recent Medical Visit for Illness: Follow-Up Call (Adult) guideline. Paging DoctorName Phone DateTime Result/ Outcome Message Type Notes Walker Kehr MD [phone] 06/06/2021 7:02:21 PM Called On Call Provider - Reached Doctor Paged Walker Kehr - MD 06/06/2021 7:03:39 PM Spoke with On Call - General Message Result OCP states he will call in medicine   Rx's sent in by Dr. Alain Marion

## 2021-08-31 ENCOUNTER — Encounter: Payer: Self-pay | Admitting: Pediatrics

## 2021-08-31 NOTE — Telephone Encounter (Signed)
Responded to MyChart message, appointment made for this afternoon RE diaper rash and diarrhea.

## 2021-09-07 ENCOUNTER — Encounter: Payer: 59 | Admitting: Family Medicine

## 2021-09-22 ENCOUNTER — Other Ambulatory Visit (HOSPITAL_BASED_OUTPATIENT_CLINIC_OR_DEPARTMENT_OTHER): Payer: Self-pay

## 2021-09-25 NOTE — Progress Notes (Deleted)
[place] at [facility] [address], [place], [place] [address] [phone]  Date:  09/28/2021   Name:  Shanee Batch   DOB:  [birth year]   MRN:  [id number]  PCP:  Darreld Mclean, MD    Chief Complaint: No chief complaint on file.   History of Present Illness:  Celinda Dethlefs is a 36 y.o. very pleasant female patient who presents with the following:  Patient is seen today for physical exam Most recent visit with myself June 2021 She is generally in good health except for history of horseshoe kidney status post repair at age 35 She had some fertility challenges, but happily delivered a healthy baby girl at term in May  She contacted me in August-at that time she had completed breast-feeding and would like to go back on Lexapro.  I restarted this medication for her  Flu vaccine COVID booster Consider recheck CBC and other routine labs, she had leukocytosis to 23,000/hemoglobin 8.8 at time of discharge from the hospital Patient Active Problem List   Diagnosis Date Noted   Bronchitis 06/06/2021   Pregnant and not yet delivered 01/14/2021   Counseling of expectant parents at pediatric pre-birth visit 12/11/2020    Past Medical History:  Diagnosis Date   Anxiety    [employer] 12/08/2020   Depression     Past Surgical History:  Procedure Laterality Date   BREAST ENHANCEMENT SURGERY Bilateral 2018   BREAST SURGERY N/A    [employer] 12/08/2020   URETER SURGERY  1992    Social History   Tobacco Use   Smoking status: Never   Smokeless tobacco: Never  Vaping Use   Vaping Use: Never used  Substance Use Topics   Alcohol use: Not Currently    Comment: social   Drug use: No    Family History  Problem Relation Age of Onset   Hypertension Father    Hyperlipidemia Father    Bladder Cancer Father    Breast cancer Maternal Grandmother     Allergies  Allergen Reactions   Onion     Red onion   Sulfa Antibiotics      Medication list has been reviewed and updated.  Current Outpatient Medications on File Prior to Visit  Medication Sig Dispense Refill   azithromycin (ZITHROMAX Z-PAK) 250 MG tablet As directed 6 each 0   escitalopram (LEXAPRO) 10 MG tablet Take 1 tablet (10 mg total) by mouth daily. 90 tablet 1   escitalopram (LEXAPRO) 10 MG tablet Take 1 tablet (10 mg total) by mouth daily. 90 tablet 1   predniSONE (DELTASONE) 10 MG tablet TAKE 3 TABLETS PO QD FOR 3 DAYS THEN TAKE 2 TABLETS PO QD FOR 3 DAYS THEN TAKE 1 TABLET PO QD FOR 3 DAYS THEN TAKE 1/2 TAB PO QD FOR 3 DAYS 20 tablet 0   Prenatal Vit-Fe Fumarate-FA (PRENATAL MULTIVITAMIN) TABS tablet Take 1 tablet by mouth daily at 12 noon.     No current facility-administered medications on file prior to visit.    Review of Systems:  As per HPI- otherwise negative.   Physical Examination: There were no vitals filed for this visit. There were no vitals filed for this visit. There is no height or weight on file to calculate BMI. Ideal Body Weight:    GEN: no acute distress. HEENT: Atraumatic, Normocephalic.  Ears and Nose: No external deformity. CV: RRR, No M/G/R. No JVD. No thrill. No extra heart sounds. PULM: CTA  B, no wheezes, crackles, rhonchi. No retractions. No resp. distress. No accessory muscle use. ABD: S, NT, ND, +BS. No rebound. No HSM. EXTR: No c/c/e PSYCH: Normally interactive. Conversant.    Assessment and Plan: ***  Physical exam today.  Encouraged healthy diet and exercise routine  Signed Lamar Blinks, MD

## 2021-09-25 NOTE — Patient Instructions (Signed)
It was great to see you again today!  

## 2021-09-28 ENCOUNTER — Ambulatory Visit (INDEPENDENT_AMBULATORY_CARE_PROVIDER_SITE_OTHER): Payer: 59 | Admitting: Family Medicine

## 2021-09-28 DIAGNOSIS — Z538 Procedure and treatment not carried out for other reasons: Secondary | ICD-10-CM

## 2021-09-29 NOTE — Progress Notes (Signed)
Not seen

## 2021-10-09 ENCOUNTER — Telehealth: Payer: 59 | Admitting: Family

## 2021-10-09 DIAGNOSIS — J019 Acute sinusitis, unspecified: Secondary | ICD-10-CM | POA: Diagnosis not present

## 2021-10-09 MED ORDER — AMOXICILLIN-POT CLAVULANATE 875-125 MG PO TABS: 1.0000 | ORAL_TABLET | Freq: Two times a day (BID) | ORAL | 0 refills | Status: DC

## 2021-10-09 MED ORDER — AMOXICILLIN-POT CLAVULANATE 600-42.9 MG/5ML PO SUSR: 875.0000 mg | Freq: Two times a day (BID) | ORAL | 0 refills | Status: AC

## 2021-10-09 NOTE — Progress Notes (Signed)

## 2021-10-09 NOTE — Addendum Note (Signed)
Addended by: Evelina Dun A on: 10/09/2021 08:40 AM   Modules accepted: Orders

## 2021-10-24 ENCOUNTER — Encounter: Payer: Self-pay | Admitting: Family Medicine

## 2021-11-25 ENCOUNTER — Other Ambulatory Visit: Payer: Self-pay | Admitting: Family Medicine

## 2021-11-28 ENCOUNTER — Other Ambulatory Visit (HOSPITAL_BASED_OUTPATIENT_CLINIC_OR_DEPARTMENT_OTHER): Payer: Self-pay

## 2021-11-28 MED ORDER — ESCITALOPRAM OXALATE 10 MG PO TABS
10.0000 mg | ORAL_TABLET | Freq: Every day | ORAL | 1 refills | Status: DC
  Filled 2021-11-28: qty 90, 90d supply, fill #0
  Filled 2022-03-15: qty 90, 90d supply, fill #1

## 2021-12-11 IMAGING — US US MFM OB DETAIL+14 WK
1 series · 13 of 28 positions shown · non-contrast
Comparison: none

[Series 1: us mfm ob detail+14 wk · 13 of 76 slices shown]
[im 3/76]
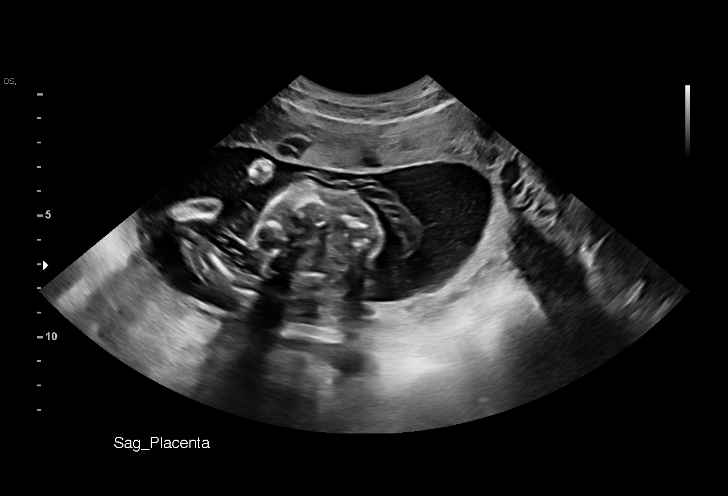
[im 9/76]
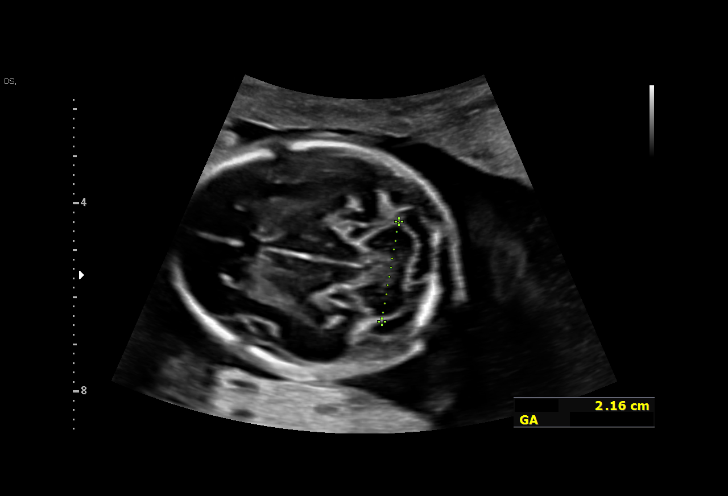
[im 14/76]
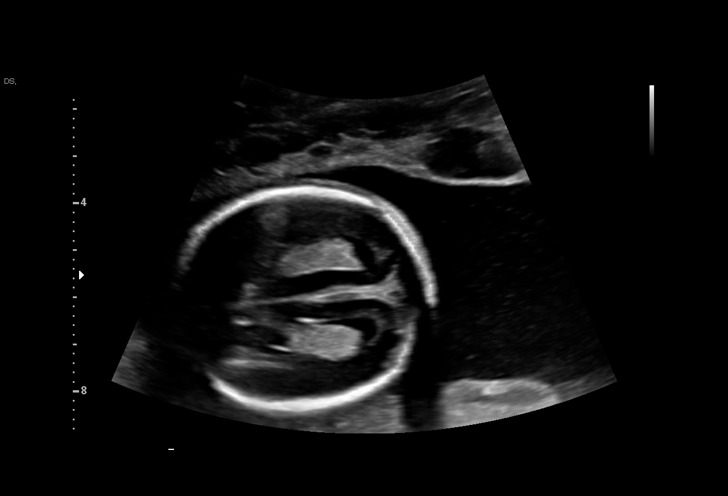
[im 20/76]
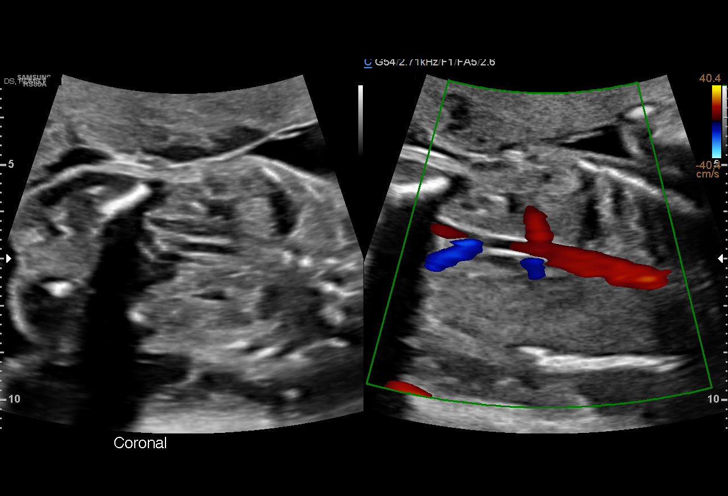
[im 26/76]
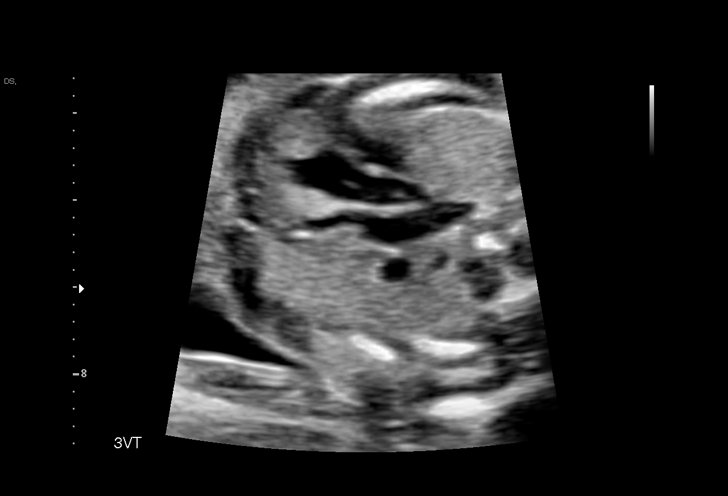
[im 31/76]
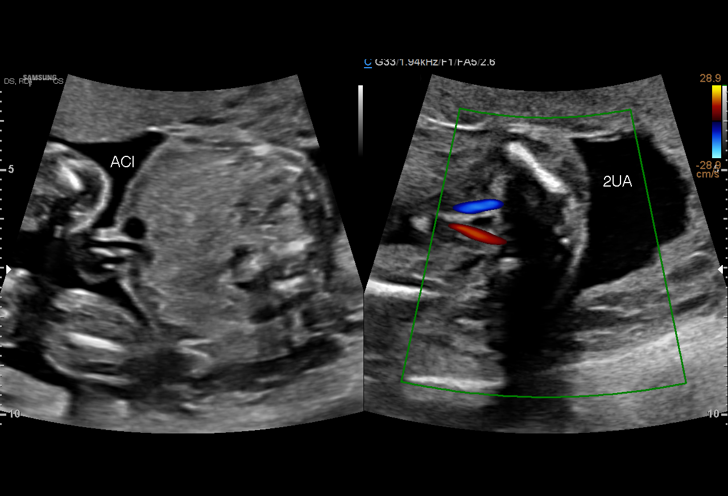
[im 39/76]
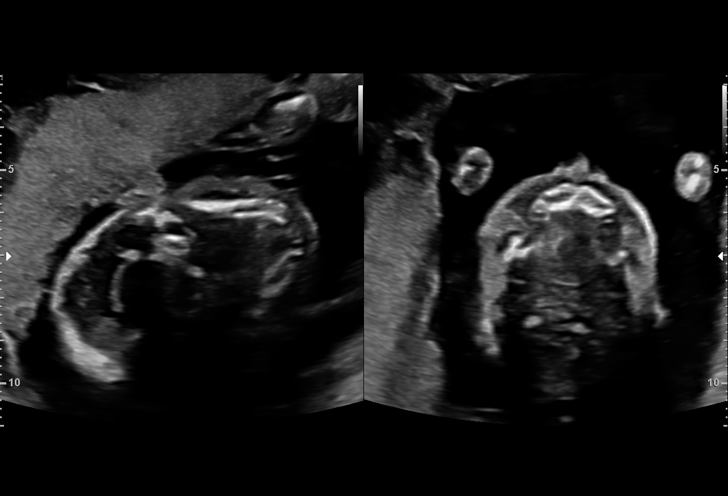
[im 45/76]
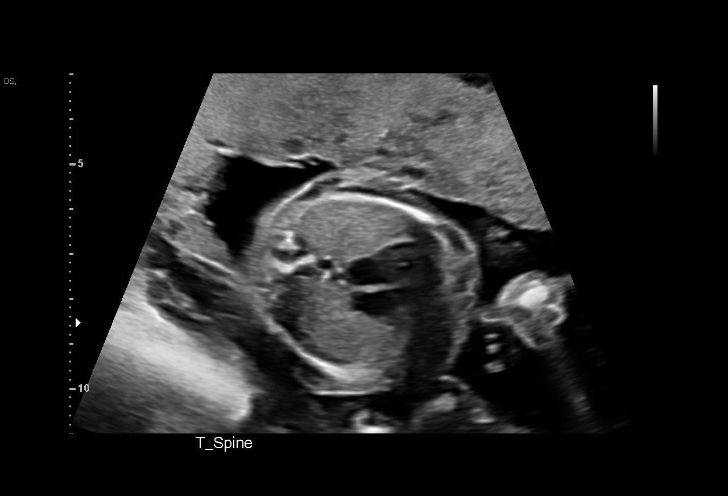
[im 51/76]
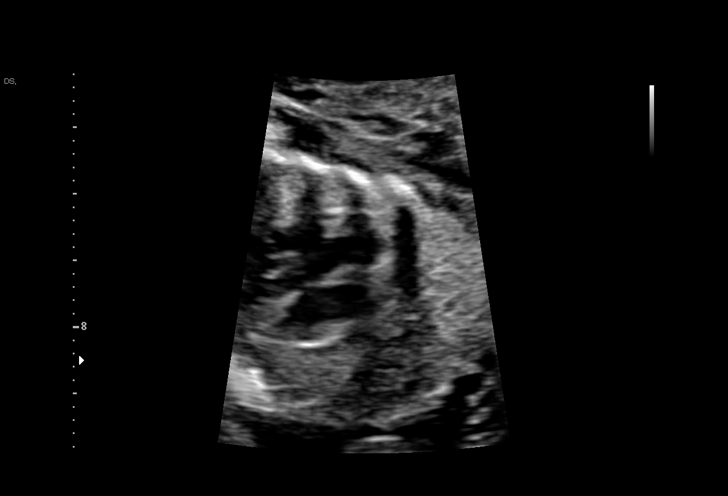
[im 56/76]
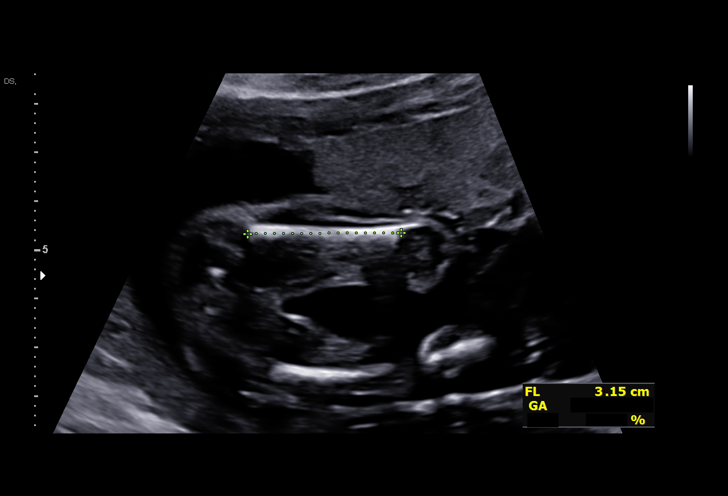
[im 62/76]
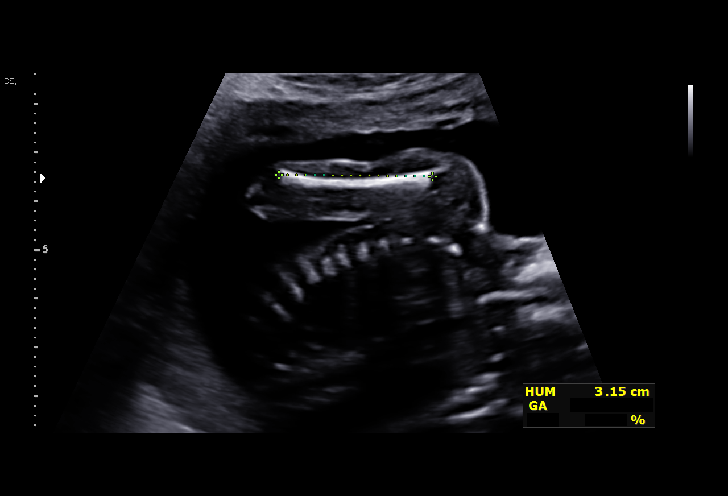
[im 67/76]
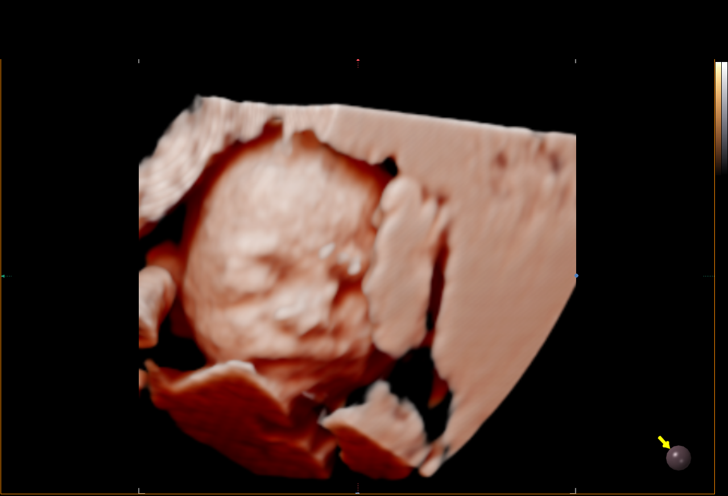
[im 73/76]
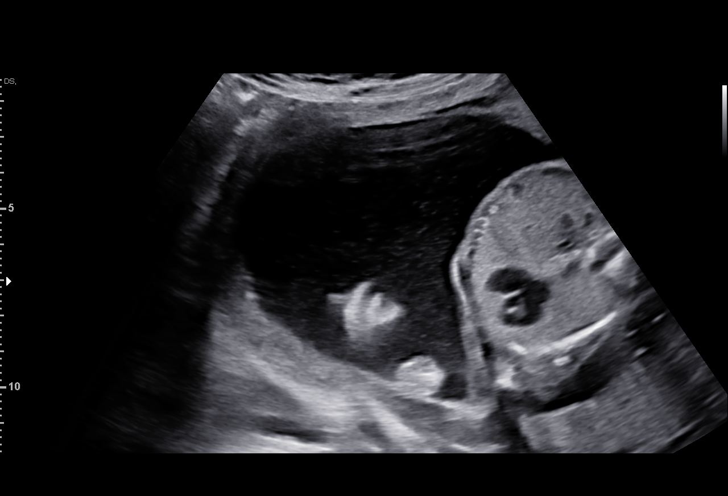

[13 of 28 positions shown; findings below may reference images not displayed]

OBGYN

Indications

 Advanced maternal age primigravida 33+,
 second trimester
 Pregnancy resulting from assisted
 reproductive technology
 Encounter for antenatal screening for
 malformations
 20 weeks gestation of pregnancy
Fetal Evaluation

 Num Of Fetuses:         1
 Cardiac Activity:       Observed
 Presentation:           Cephalic
 Placenta:               Right lateral
 P. Cord Insertion:      Visualized, central

 Amniotic Fluid
 AFI FV:      Within normal limits
Biometry

 BPD:      48.1  mm     G. Age:  20w 4d         48  %    CI:        75.83   %    70 - 86
                                                         FL/HC:      18.9   %    15.9 -
 HC:      175.1  mm     G. Age:  20w 0d         19  %    HC/AC:      1.12        1.06 -
 AC:      156.5  mm     G. Age:  20w 6d         52  %    FL/BPD:     68.8   %
 FL:       33.1  mm     G. Age:  20w 2d         34  %    FL/AC:      21.2   %    20 - 24
 HUM:      31.4  mm     G. Age:  20w 3d         46  %
 CER:      21.6  mm     G. Age:  20w 3d         60  %
 NFT:       4.4  mm

 LV:        6.2  mm
 CM:          4  mm

 Est. FW:     361  gm    0 lb 13 oz      44  %
Gestational Age

 LMP:           20w 4d        Date:  04/09/20                 EDD:   01/14/21
 U/S Today:     20w 3d                                        EDD:   01/15/21
 Best:          20w 4d     Det. By:  LMP  (04/09/20)          EDD:   01/14/21
Anatomy

 Cranium:               Appears normal         LVOT:                   Appears normal
 Cavum:                 Appears normal         Aortic Arch:            Appears normal
 Ventricles:            Appears normal         Ductal Arch:            Appears normal
 Choroid Plexus:        Appears normal         Diaphragm:              Appears normal
 Cerebellum:            Appears normal         Stomach:                Appears normal, left
                                                                       sided
 Posterior Fossa:       Appears normal         Abdomen:                Appears normal
 Nuchal Fold:           Appears normal         Abdominal Wall:         Appears nml (cord
                                                                       insert, abd wall)
 Face:                  Appears normal         Cord Vessels:           Appears normal (3
                        (orbits and profile)                           vessel cord)
 Lips:                  Appears normal         Kidneys:                Appear normal
 Palate:                Appears normal         Bladder:                Appears normal
 Thoracic:              Appears normal         Spine:                  Appears normal
 Heart:                 Appears normal; EIF    Upper Extremities:      Appears normal
 RVOT:                  Appears normal         Lower Extremities:      Appears normal
Cervix Uterus Adnexa

 Cervix
 Length:              4  cm.
 Normal appearance by transabdominal scan.

 Right Ovary
 Within normal limits.

 Left Ovary
 Within normal limits.
Comments

 This patient was seen for a detailed fetal anatomy scan due
 to advanced maternal age.  This is an IVF pregnancy.
 She denies any significant past medical history and denies
 any problems in her current pregnancy.
 She had a cell free DNA test earlier in her pregnancy which
 indicated a low risk for trisomy 21, 18, and 13. A female fetus
 is predicted.
 She was informed that the fetal growth and amniotic fluid
 level were appropriate for her gestational age.
 On today's exam, an intracardiac echogenic focus was noted
 in the left ventricle of the fetal heart.  The small association
 between an echogenic focus and Down syndrome was
 discussed. Due to the echogenic focus noted today, the
 patient was offered and declined an amniocentesis today for
 definitive diagnosis of fetal aneuploidy.  She reports that she
 is comfortable with her negative cell free DNA test.
 The patient was informed that anomalies may be missed due
 to technical limitations. If the fetus is in a suboptimal position
 or maternal habitus is increased, visualization of the fetus in
 the maternal uterus may be impaired.
 Due to the IVF pregnancy, she was referred for a fetal
 echocardiogram with [facility].
 Due to advanced maternal age, a follow-up growth scan was
 scheduled in the third trimester

## 2022-01-17 IMAGING — US US RENAL
1 series · 15 of 25 positions shown · non-contrast
Comparison: None

CLINICAL DATA: RIGHT flank pain, pregnant

EXAM:
RENAL / URINARY TRACT ULTRASOUND COMPLETE

[Series 1: us renal · 15 of 40 slices shown]
[im 1/40]
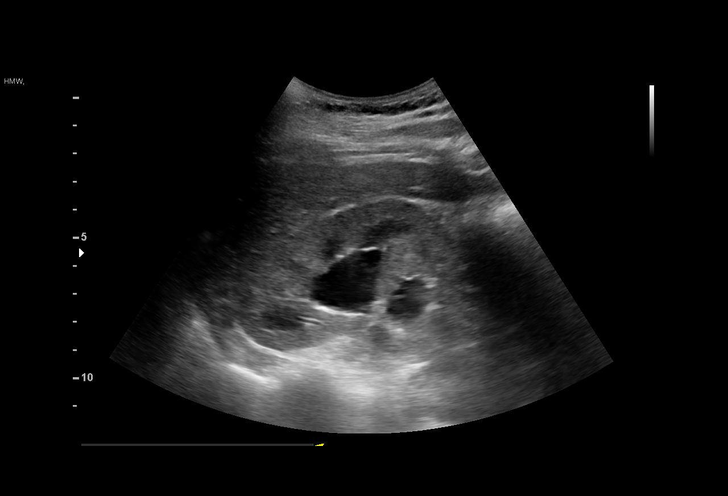
[im 4/40]
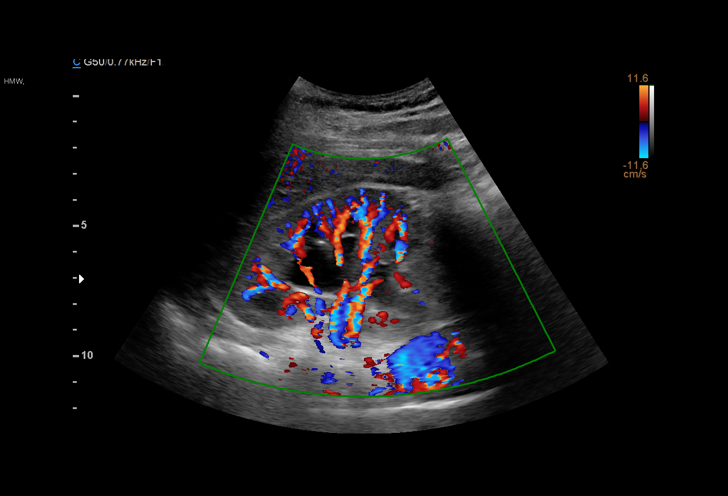
[im 7/40]
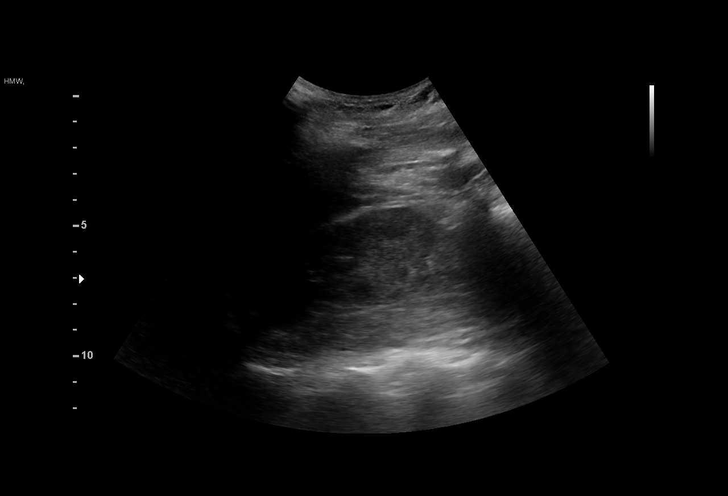
[im 9/40]
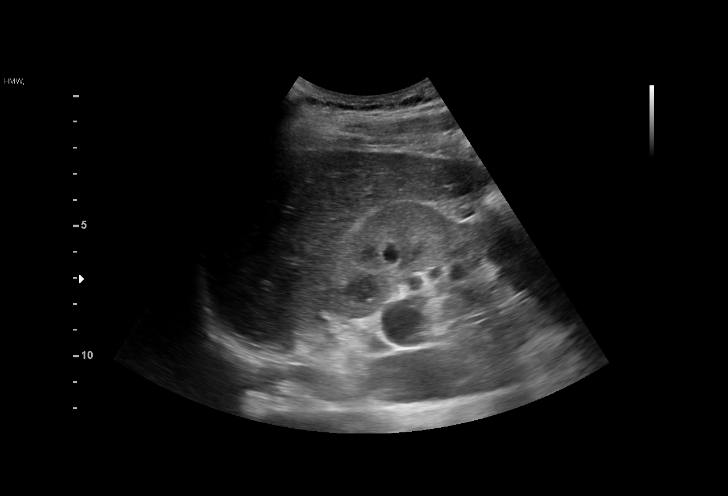
[im 12/40]
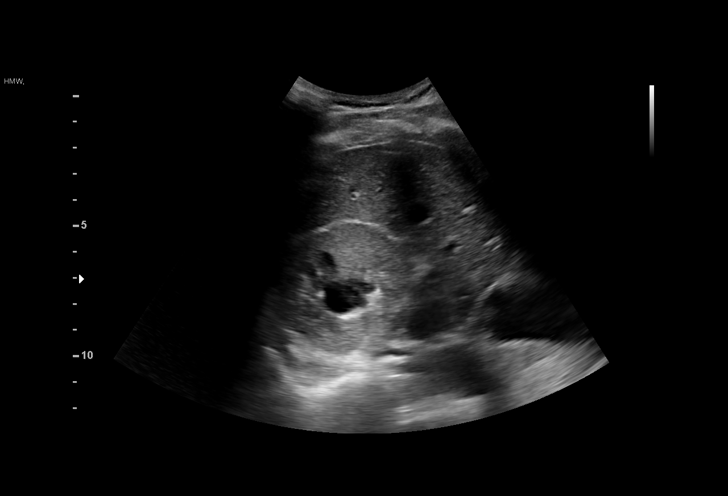
[im 15/40]
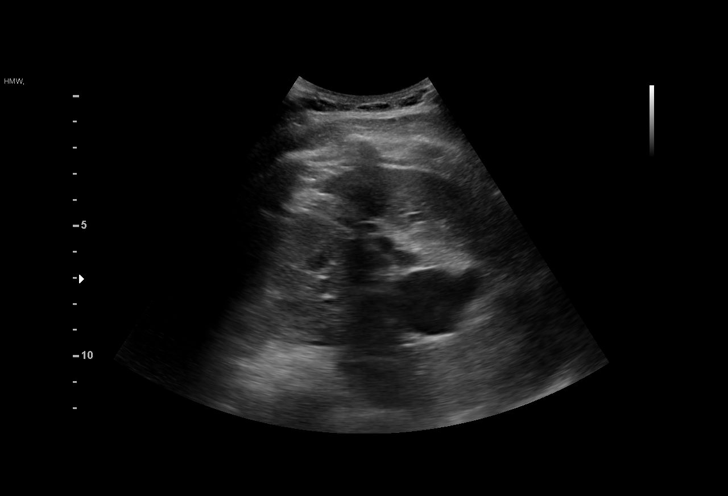
[im 17/40]
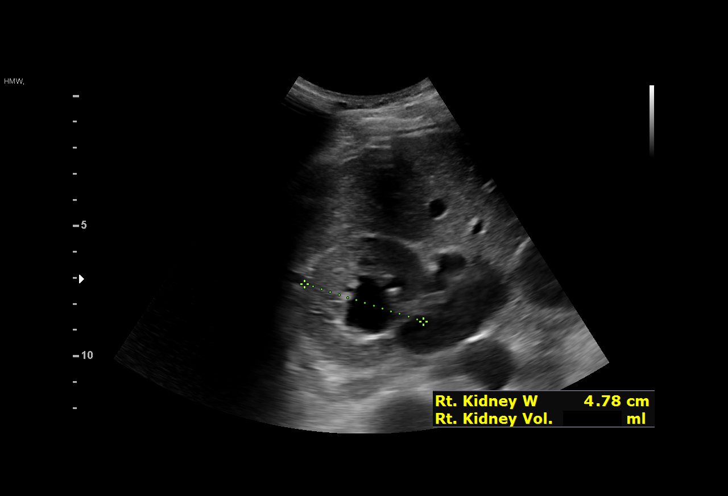
[im 20/40]
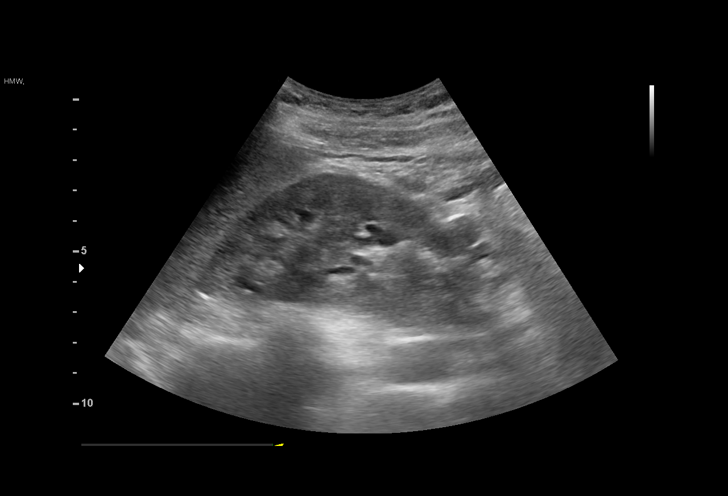
[im 23/40]
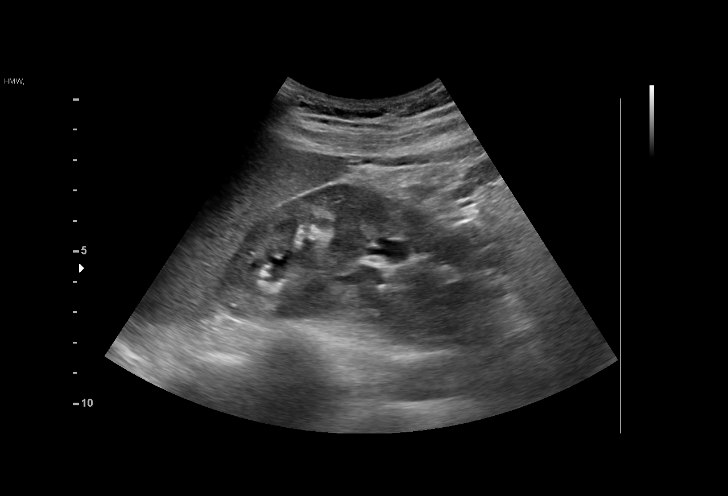
[im 25/40]
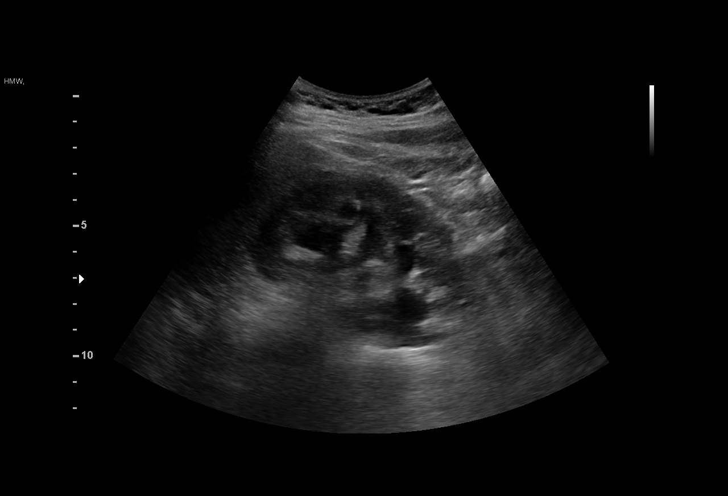
[im 28/40]
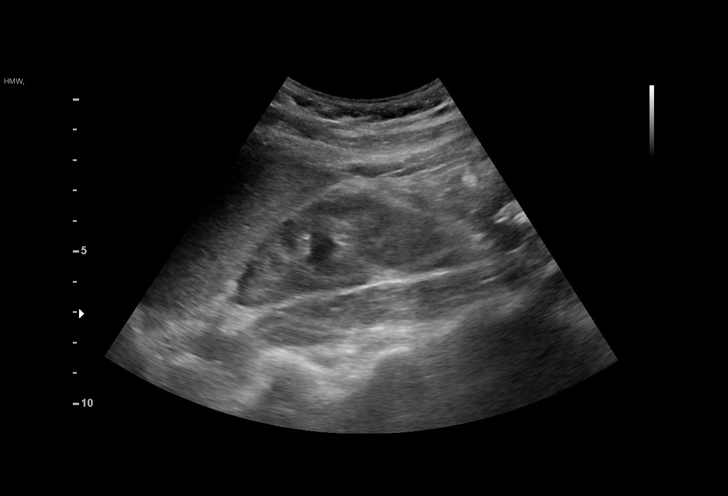
[im 31/40]
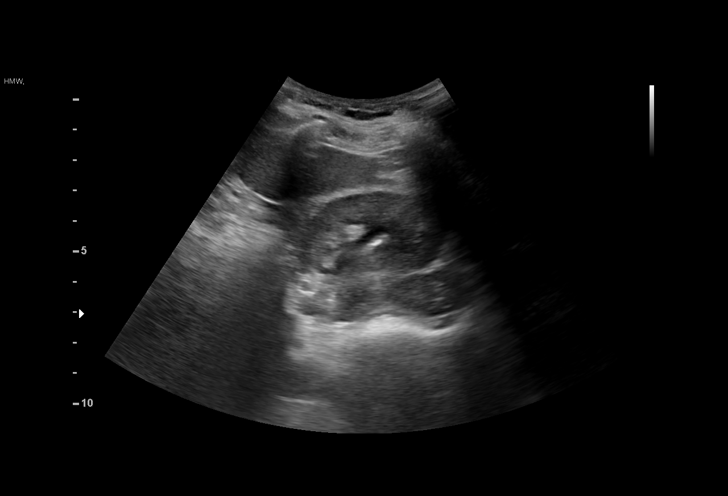
[im 33/40]
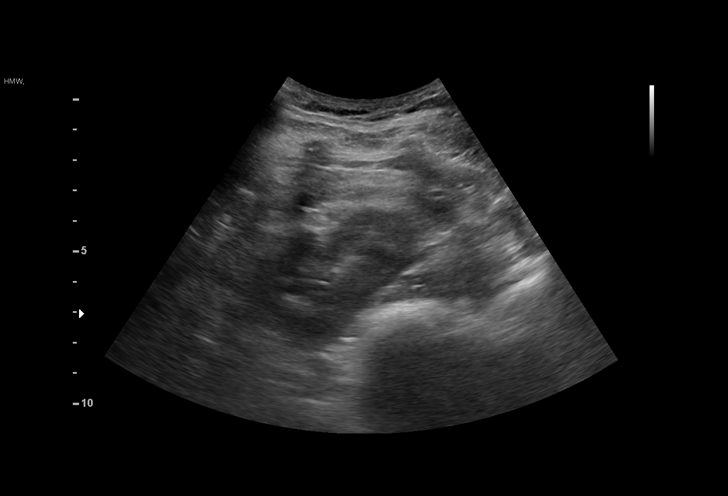
[im 36/40]
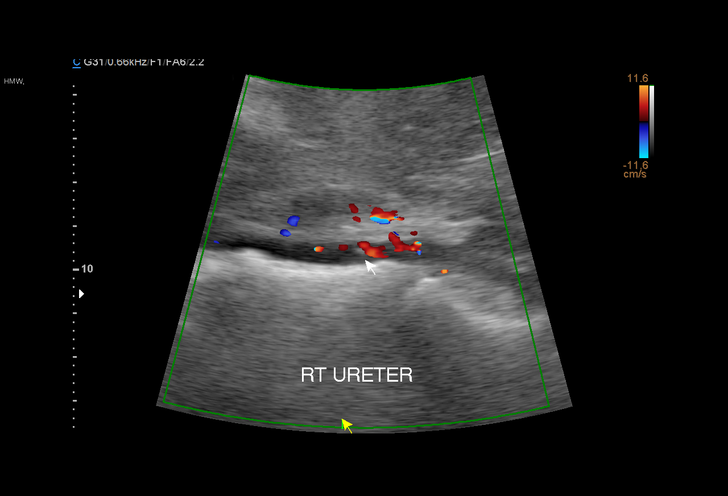
[im 40/40]
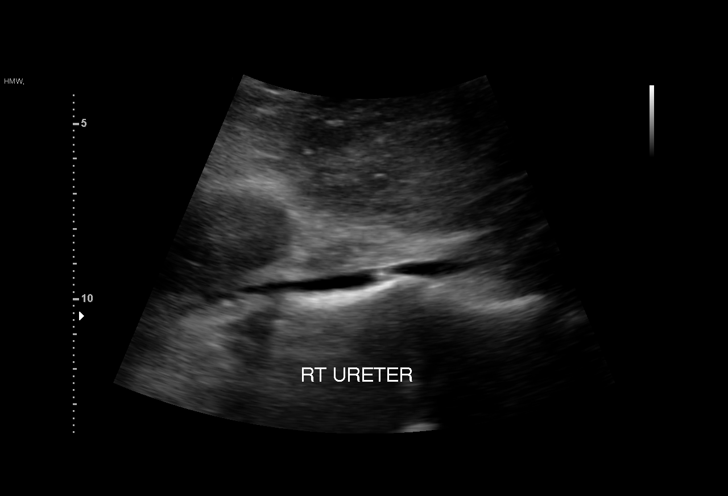

[15 of 25 positions shown; findings below may reference images not displayed]

FINDINGS: Right Kidney:

Renal measurements: 9.1 x 5.4 x 4.8 cm = volume: 123 mL. Mild LEFT
hydronephrosis. No renal mass or shadowing intrarenal calcification.
RIGHT ureter appears mildly dilated. A 6 mm suspected filling defect
is seen within the RIGHT ureter, image 38, though the ureter beyond
this potential calculus remains dilated.

Left Kidney:

Renal measurements: 8.0 x 4.4 x 4.4 cm = volume: 80 mL. Normal
cortical thickness and echogenicity. Minimal collecting system
dilatation. No mass or shadowing calcification.

Bladder:

Poorly assessed due to gravid uterus and low volume.

Other:

N/A
IMPRESSION: Minimal LEFT hydronephrosis.

Mild RIGHT hydronephrosis with mild RIGHT ureteral dilatation.

Potential 6 mm calculus RIGHT ureteral calculus though the RIGHT
ureter beyond this intraluminal focus remains dilated.

## 2022-03-15 ENCOUNTER — Other Ambulatory Visit (HOSPITAL_COMMUNITY): Payer: Self-pay

## 2022-03-18 ENCOUNTER — Ambulatory Visit
Admission: EM | Admit: 2022-03-18 | Discharge: 2022-03-18 | Disposition: A | Discharge status: Home / Self Care | Payer: 59 | Attending: Emergency Medicine | Admitting: Emergency Medicine

## 2022-03-18 ENCOUNTER — Ambulatory Visit: Admit: 2022-03-18 | Payer: 59 | Source: Home / Self Care

## 2022-03-18 ENCOUNTER — Encounter: Payer: Self-pay | Admitting: Emergency Medicine

## 2022-03-18 ENCOUNTER — Other Ambulatory Visit: Payer: Self-pay

## 2022-03-18 DIAGNOSIS — B9689 Other specified bacterial agents as the cause of diseases classified elsewhere: Secondary | ICD-10-CM | POA: Insufficient documentation

## 2022-03-18 DIAGNOSIS — J029 Acute pharyngitis, unspecified: Secondary | ICD-10-CM | POA: Diagnosis not present

## 2022-03-18 DIAGNOSIS — J038 Acute tonsillitis due to other specified organisms: Secondary | ICD-10-CM | POA: Diagnosis not present

## 2022-03-18 LAB — POCT RAPID STREP A (OFFICE): Rapid Strep A Screen: NEGATIVE

## 2022-03-18 MED ORDER — LIDOCAINE VISCOUS HCL 2 % MT SOLN: 15.0000 mL | OROMUCOSAL | 0 refills | Status: DC | PRN

## 2022-03-18 MED ORDER — CEFDINIR 250 MG/5ML PO SUSR: 300.0000 mg | Freq: Two times a day (BID) | ORAL | 0 refills | Status: AC

## 2022-03-18 NOTE — Discharge Instructions (Signed)
Your strep test today is negative.  Streptococcal throat culture will be performed per our protocol, please keep in mind that the rapid strep test that we perform here at urgent care only catches 40% of strep throat infections.   Based on my physical exam findings and the history you provided to me today, I recommend that you begin antibiotics now for presumed strep throat instead of waiting for the strep culture result.  I have sent a prescription to your pharmacy.  After 24 hours of antibiotics, you should begin to feel significantly better.     After 24 hours of taking antibiotics, please discard your toothbrush as well as any other oral devices that you are currently using and replace them with new ones to avoid reinfection.   If your streptococcal throat culture has a negative result but you feel significantly better after taking antibiotics for 24 to 48 hours, I strongly recommend that you finish the full 10-day course.  Bacterial culture tests are only as reliable as the laboratory technician performing them.  Alternately, if your streptococcal throat culture has a negative result and you see no improvement of your symptoms after 24 to 48 hours of antibiotics, please discontinue the antibiotics as they are no longer indicated.  Your throat infection will then be most likely considered viral and will have to resolve on its own.   Please see the list below for recommended medications, dosages and frequencies to provide relief of your current symptoms:    Omnicef (cefdinir): Please take 18m twice daily for 10 days, you can take it with or without food.  This antibiotic can cause upset stomach, this will resolve once antibiotics are complete.  Please avoid antacids that contain magnesium or calcium as they combined with cefdinir and render it inert.  Advil, Motrin (ibuprofen) or Aleve (naproxen): These are a good anti-inflammatory medications which address aches, pains and inflammation of the upper  airways that causes sinus and nasal congestion as well as in the lower airways which makes your cough feel tight and sometimes burn.  I recommend that you take between 2 to 3 tablets of ibuprofen every 6-8 hours or 1 to 2 tablets of Aleve every 8-12 hours as needed.      Xylocaine (lidocaine): This is a numbing medication that can be swished for 15 seconds and swallowed.  You can use this every 3 hours while awake to relieve pain in your mouth and throat.  I have sent a prescription for this medication to your pharmacy.   Please follow-up within the next 7-10 days either with your primary care provider or urgent care if your symptoms do not resolve.  If you do not have a primary care provider, we will assist you in finding one.   Thank you for visiting urgent care today.  We appreciate the opportunity to participate in your care.

## 2022-03-18 NOTE — ED Triage Notes (Signed)
Patient c/o sore throat x 4 days.   Patient is unaware of temperature at home.   Patient endorses progressively worsening pain and painful swallowing.   Patient endorses " I have tiny red dots on my throat".   Patient has used throat spray, Aleve, warm fluids, and lozenges with no relief of symptoms.

## 2022-03-18 NOTE — ED Provider Notes (Signed)
[facility]    CSN: [id number] Arrival date & time: 03/18/22  1001    HISTORY   Chief Complaint  Patient presents with   Sore Throat   HPI Debbie Harris is a pleasant, 36 y.o. female who presents to urgent care today. Patient complains of a 4-day history of sore throat without fever.  Patient states the pain has become progressively worse and now has "tiny red dots in my throat".  Patient states she has been using Chloraseptic throat spray, Aleve, drinking warm tea and throat lozenges with no relief of her symptoms.  Patient states her daughter has been feeling unwell and that her husband has the exact same symptoms she has.  Patient states that both of them are being seen later today.  The history is provided by the patient.   Past Medical History:  Diagnosis Date   Anxiety    Phreesia 12/08/2020   Depression    Patient Active Problem List   Diagnosis Date Noted   Bronchitis 06/06/2021   Pregnant and not yet delivered 01/14/2021   Counseling of expectant parents at pediatric pre-birth visit 12/11/2020   Past Surgical History:  Procedure Laterality Date   BREAST ENHANCEMENT SURGERY Bilateral 2018   BREAST SURGERY N/A    Phreesia 12/08/2020   URETER SURGERY  1992   OB History     Gravida  1   Para  1   Term  1   Preterm      AB      Living  1      SAB      IAB      Ectopic      Multiple  0   Live Births  1          Home Medications    Prior to Admission medications   Medication Sig Start Date End Date Taking? Authorizing Provider  cefdinir (OMNICEF) 250 MG/5ML suspension Take 6 mLs (300 mg total) by mouth 2 (two) times daily for 10 days. 03/18/22 03/28/22 Yes Lynden Oxford Scales, PA-C  escitalopram (LEXAPRO) 10 MG tablet Take 1 tablet (10 mg total) by mouth daily. 11/28/21  Yes Copland, Gay Filler, MD  lidocaine (XYLOCAINE) 2 % solution Use as directed 15 mLs in the mouth or throat every 3 (three) hours as needed for mouth pain  (Sore throat). 03/18/22  Yes Lynden Oxford Scales, PA-C  escitalopram (LEXAPRO) 10 MG tablet Take 1 tablet (10 mg total) by mouth daily. 04/06/21   Copland, Gay Filler, MD    Family History Family History  Problem Relation Age of Onset   Hypertension Father    Hyperlipidemia Father    Bladder Cancer Father    Breast cancer Maternal Grandmother    Social History Social History   Tobacco Use   Smoking status: Never   Smokeless tobacco: Never  Vaping Use   Vaping Use: Never used  Substance Use Topics   Alcohol use: Not Currently    Comment: social   Drug use: No   Allergies   Onion and Sulfa antibiotics  Review of Systems Review of Systems Pertinent findings revealed after performing a 14 point review of systems has been noted in the history of present illness.  Physical Exam Triage Vital Signs ED Triage Vitals  Enc Vitals Group     BP 06/17/21 0827 (!) 147/82     Pulse Rate 06/17/21 0827 72     Resp 06/17/21 0827 18     Temp 06/17/21 0827 98.3  F (36.8 C)     Temp Source 06/17/21 0827 Oral     SpO2 06/17/21 0827 98 %     Weight --      Height --      Head Circumference --      Peak Flow --      Pain Score 06/17/21 0826 5     Pain Loc --      Pain Edu? --      Excl. in [place]? --   No data found.  Updated Vital Signs BP 109/77 (BP Location: Right Arm)   Pulse 65   Temp 98.2 F (36.8 C) (Oral)   Resp 16   LMP 03/09/2022 (Exact Date)   SpO2 98%   Breastfeeding No   Physical Exam Constitutional:      General: She is not in acute distress.    Appearance: She is well-developed. She is ill-appearing. She is not toxic-appearing.  HENT:     Head: Normocephalic and atraumatic.     Salivary Glands: Right salivary gland is diffusely enlarged and tender. Left salivary gland is diffusely enlarged and tender.     Right Ear: Hearing and external ear normal.     Left Ear: Hearing and external ear normal.     Ears:     Comments: Bilateral EACs with mild erythema,  bilateral TMs are normal    Nose: No mucosal edema, congestion or rhinorrhea.     Right Turbinates: Not enlarged, swollen or pale.     Left Turbinates: Not enlarged or swollen.     Right Sinus: No maxillary sinus tenderness or frontal sinus tenderness.     Left Sinus: No maxillary sinus tenderness or frontal sinus tenderness.     Mouth/Throat:     Lips: Pink. No lesions.     Mouth: Mucous membranes are moist. No oral lesions or angioedema.     Dentition: No gingival swelling.     Tongue: No lesions.     Palate: No mass.     Pharynx: Uvula midline. Pharyngeal swelling, oropharyngeal exudate and posterior oropharyngeal erythema present. No uvula swelling.     Tonsils: Tonsillar exudate present. 2+ on the right. 2+ on the left.  Eyes:     Extraocular Movements: Extraocular movements intact.     Conjunctiva/sclera: Conjunctivae normal.     Pupils: Pupils are equal, round, and reactive to light.  Neck:     Thyroid: No thyroid mass, thyromegaly or thyroid tenderness.     Trachea: Tracheal tenderness present. No abnormal tracheal secretions or tracheal deviation.     Comments: Voice is muffled Cardiovascular:     Rate and Rhythm: Normal rate and regular rhythm.     Pulses: Normal pulses.     Heart sounds: Normal heart sounds, S1 normal and S2 normal. No murmur heard.    No friction rub. No gallop.  Pulmonary:     Effort: Pulmonary effort is normal. No accessory muscle usage, prolonged expiration, respiratory distress or retractions.     Breath sounds: No stridor, decreased air movement or transmitted upper airway sounds. No decreased breath sounds, wheezing, rhonchi or rales.  Abdominal:     General: Bowel sounds are normal.     Palpations: Abdomen is soft.     Tenderness: There is generalized abdominal tenderness. There is no right CVA tenderness, left CVA tenderness or rebound. Negative signs include Murphy's sign.     Hernia: No hernia is present.  Musculoskeletal:        General: No  tenderness. Normal range of motion.     Cervical back: Full passive range of motion without pain, normal range of motion and neck supple.     Right lower leg: No edema.     Left lower leg: No edema.  Lymphadenopathy:     Cervical: Cervical adenopathy present.     Right cervical: Superficial cervical adenopathy present.     Left cervical: Superficial cervical adenopathy present.  Skin:    General: Skin is warm and dry.     Findings: No erythema, lesion or rash.  Neurological:     General: No focal deficit present.     Mental Status: She is alert and oriented to person, place, and time. Mental status is at baseline.  Psychiatric:        Mood and Affect: Mood normal.        Behavior: Behavior normal.        Thought Content: Thought content normal.        Judgment: Judgment normal.     Visual Acuity Right Eye Distance:   Left Eye Distance:   Bilateral Distance:    Right Eye Near:   Left Eye Near:    Bilateral Near:     UC Couse / Diagnostics / Procedures:     Radiology No results found.  Procedures Procedures (including critical care time) EKG  Pending results:  Labs Reviewed  CULTURE, GROUP A STREP [facility])  POCT RAPID STREP A (OFFICE)    Medications Ordered in UC: Medications - No data to display  UC Diagnoses / Final Clinical Impressions(s)   I have reviewed the triage vital signs and the nursing notes.  Pertinent labs & imaging results that were available during my care of the patient were reviewed by me and considered in my medical decision making (see chart for details).    Final diagnoses:  Sore throat  Acute bacterial tonsillitis   Rapid strep test today is negative but given patient's physical exam findings and sick contacts at home, believe that she has bacterial infection in her tonsils at this time.  Patient provided with prescription for cefdinir and lidocaine for comfort.   Throat culture is pending.  Return precautions advised.  ED Prescriptions      Medication Sig Dispense Auth. Provider   cefdinir (OMNICEF) 250 MG/5ML suspension Take 6 mLs (300 mg total) by mouth 2 (two) times daily for 10 days. 120 mL Lynden Oxford Scales, PA-C   lidocaine (XYLOCAINE) 2 % solution Use as directed 15 mLs in the mouth or throat every 3 (three) hours as needed for mouth pain (Sore throat). 300 mL Lynden Oxford Scales, PA-C      PDMP not reviewed this encounter.  Disposition Upon Discharge:  Condition: stable for discharge home Home: take medications as prescribed; routine discharge instructions as discussed; follow up as advised.  Patient presented with an acute illness with associated systemic symptoms and significant discomfort requiring urgent management. In my opinion, this is a condition that a prudent lay person (someone who possesses an average knowledge of health and medicine) may potentially expect to result in complications if not addressed urgently such as respiratory distress, impairment of bodily function or dysfunction of bodily organs.   Routine symptom specific, illness specific and/or disease specific instructions were discussed with the patient and/or caregiver at length.   As such, the patient has been evaluated and assessed, work-up was performed and treatment was provided in alignment with urgent care protocols and evidence based medicine.  Patient/parent/caregiver has  been advised that the patient may require follow up for further testing and treatment if the symptoms continue in spite of treatment, as clinically indicated and appropriate.  If the patient was tested for COVID-19, Influenza and/or RSV, then the patient/parent/guardian was advised to isolate at home pending the results of his/her diagnostic coronavirus test and potentially longer if they're positive. I have also advised pt that if his/her COVID-19 test returns positive, it's recommended to self-isolate for at least 10 days after symptoms first appeared AND until  fever-free for 24 hours without fever reducer AND other symptoms have improved or resolved. Discussed self-isolation recommendations as well as instructions for household member/close contacts as per the [facility] and [place] DHHS, and also gave patient the [place] packet with this information.  Patient/parent/caregiver has been advised to return to the [facility] or PCP in 3-5 days if no better; to PCP or the Emergency Department if new signs and symptoms develop, or if the current signs or symptoms continue to change or worsen for further workup, evaluation and treatment as clinically indicated and appropriate  The patient will follow up with their current PCP if and as advised. If the patient does not currently have a PCP we will assist them in obtaining one.   The patient may need specialty follow up if the symptoms continue, in spite of conservative treatment and management, for further workup, evaluation, consultation and treatment as clinically indicated and appropriate.  Patient/parent/caregiver verbalized understanding and agreement of plan as discussed.  All questions were addressed during visit.  Please see discharge instructions below for further details of plan.  Discharge Instructions:   Discharge Instructions      Your strep test today is negative.  Streptococcal throat culture will be performed per our protocol, please keep in mind that the rapid strep test that we perform here at urgent care only catches 40% of strep throat infections.   Based on my physical exam findings and the history you provided to me today, I recommend that you begin antibiotics now for presumed strep throat instead of waiting for the strep culture result.  I have sent a prescription to your pharmacy.  After 24 hours of antibiotics, you should begin to feel significantly better.     After 24 hours of taking antibiotics, please discard your toothbrush as well as any other oral devices that you are currently using and replace  them with new ones to avoid reinfection.   If your streptococcal throat culture has a negative result but you feel significantly better after taking antibiotics for 24 to 48 hours, I strongly recommend that you finish the full 10-day course.  Bacterial culture tests are only as reliable as the laboratory technician performing them.  Alternately, if your streptococcal throat culture has a negative result and you see no improvement of your symptoms after 24 to 48 hours of antibiotics, please discontinue the antibiotics as they are no longer indicated.  Your throat infection will then be most likely considered viral and will have to resolve on its own.   Please see the list below for recommended medications, dosages and frequencies to provide relief of your current symptoms:    Omnicef (cefdinir): Please take 91m twice daily for 10 days, you can take it with or without food.  This antibiotic can cause upset stomach, this will resolve once antibiotics are complete.  Please avoid antacids that contain magnesium or calcium as they combined with cefdinir and render it inert.  Advil, Motrin (ibuprofen) or Aleve (  naproxen): These are a good anti-inflammatory medications which address aches, pains and inflammation of the upper airways that causes sinus and nasal congestion as well as in the lower airways which makes your cough feel tight and sometimes burn.  I recommend that you take between 2 to 3 tablets of ibuprofen every 6-8 hours or 1 to 2 tablets of Aleve every 8-12 hours as needed.      Xylocaine (lidocaine): This is a numbing medication that can be swished for 15 seconds and swallowed.  You can use this every 3 hours while awake to relieve pain in your mouth and throat.  I have sent a prescription for this medication to your pharmacy.   Please follow-up within the next 7-10 days either with your primary care provider or urgent care if your symptoms do not resolve.  If you do not have a primary care  provider, we will assist you in finding one.   Thank you for visiting urgent care today.  We appreciate the opportunity to participate in your care.     This office note has been dictated using Museum/gallery curator.  Unfortunately, this method of dictation can sometimes lead to typographical or grammatical errors.  I apologize for your inconvenience in advance if this occurs.  Please do not hesitate to reach out to me if clarification is needed.      Lynden Oxford Scales, [place] 03/19/22 [phone]

## 2022-03-19 NOTE — Patient Instructions (Incomplete)
It was great to see you again today, I will be in touch with your lab work You seem to have a concussion- rest today, you may need to be out of work tomorrow as well. Please let me know if not feeling back to normal in the next few days  Pap today I will be in touch with your x-rays Flexeril as needed for muscle pain- will make you drowsy, don't use when working or driving   Let's have you try 15 mg of lexapro- let me know how this works for you Diflucan for yeast vaginitis

## 2022-03-19 NOTE — Progress Notes (Unsigned)
[place] [facility] [address] [place], [place] [address] [phone]  Date:  03/22/2022   Name:  Debbie Harris   DOB:  1986/01/31   MRN:  [id number]  PCP:  Darreld Mclean, MD    Chief Complaint: No chief complaint on file.   History of Present Illness:  Debbie Harris is a 37 y.o. very pleasant female patient who presents with the following:  Patient seen today for physical exam Most recent visit with myself June 2021-time [place] worked as a cardiology Psychologist, counselling.  History of horseshoe kidney status post ureter repair at age 25.  She delivered in May of last year-baby girl  Pap smear Baseline mammogram can be offered Can obtain routine blood work, hepatitis C screening  Patient Active Problem List   Diagnosis Date Noted   Bronchitis 06/06/2021   Pregnant and not yet delivered 01/14/2021   Counseling of expectant parents at pediatric pre-birth visit 12/11/2020    Past Medical History:  Diagnosis Date   Anxiety    Phreesia 12/08/2020   Depression     Past Surgical History:  Procedure Laterality Date   BREAST ENHANCEMENT SURGERY Bilateral 2018   BREAST SURGERY N/A    Phreesia 12/08/2020   URETER SURGERY  1992    Social History   Tobacco Use   Smoking status: Never   Smokeless tobacco: Never  Vaping Use   Vaping Use: Never used  Substance Use Topics   Alcohol use: Not Currently    Comment: social   Drug use: No    Family History  Problem Relation Age of Onset   Hypertension Father    Hyperlipidemia Father    Bladder Cancer Father    Breast cancer Maternal Grandmother     Allergies  Allergen Reactions   Onion     Red onion   Sulfa Antibiotics     Medication list has been reviewed and updated.  Current Outpatient Medications on File Prior to Visit  Medication Sig Dispense Refill   cefdinir (OMNICEF) 250 MG/5ML suspension Take 6 mLs (300 mg total) by mouth 2 (two) times  daily for 10 days. 120 mL 0   escitalopram (LEXAPRO) 10 MG tablet Take 1 tablet (10 mg total) by mouth daily. 90 tablet 1   escitalopram (LEXAPRO) 10 MG tablet Take 1 tablet (10 mg total) by mouth daily. 90 tablet 1   lidocaine (XYLOCAINE) 2 % solution Use as directed 15 mLs in the mouth or throat every 3 (three) hours as needed for mouth pain (Sore throat). 300 mL 0   No current facility-administered medications on file prior to visit.    Review of Systems:  As per HPI- otherwise negative.   Physical Examination: There were no vitals filed for this visit. There were no vitals filed for this visit. There is no height or weight on file to calculate BMI. Ideal Body Weight:    GEN: no acute distress. HEENT: Atraumatic, Normocephalic.  Ears and Nose: No external deformity. CV: RRR, No M/G/R. No JVD. No thrill. No extra heart sounds. PULM: CTA B, no wheezes, crackles, rhonchi. No retractions. No resp. distress. No accessory muscle use. ABD: S, NT, ND, +BS. No rebound. No HSM. EXTR: No c/c/e PSYCH: Normally interactive. Conversant.    Assessment and Plan: *** Physical exam today.  Encouraged healthy diet and exercise routine  Signed Lamar Blinks, MD

## 2022-03-21 LAB — CULTURE, GROUP A STREP (THRC)

## 2022-03-22 ENCOUNTER — Other Ambulatory Visit: Payer: Self-pay | Admitting: Family Medicine

## 2022-03-22 ENCOUNTER — Encounter: Payer: Self-pay | Admitting: Family Medicine

## 2022-03-22 ENCOUNTER — Ambulatory Visit (INDEPENDENT_AMBULATORY_CARE_PROVIDER_SITE_OTHER): Payer: 59 | Admitting: Family Medicine

## 2022-03-22 ENCOUNTER — Other Ambulatory Visit (HOSPITAL_BASED_OUTPATIENT_CLINIC_OR_DEPARTMENT_OTHER): Payer: Self-pay

## 2022-03-22 ENCOUNTER — Other Ambulatory Visit (HOSPITAL_COMMUNITY)
Admission: RE | Admit: 2022-03-22 | Discharge: 2022-03-22 | Disposition: A | Discharge status: Home / Self Care | Payer: 59 | Source: Ambulatory Visit | Attending: Family Medicine | Admitting: Family Medicine

## 2022-03-22 ENCOUNTER — Ambulatory Visit (HOSPITAL_BASED_OUTPATIENT_CLINIC_OR_DEPARTMENT_OTHER)
Admission: RE | Admit: 2022-03-22 | Discharge: 2022-03-22 | Disposition: A | Discharge status: Home / Self Care | Payer: 59 | Source: Ambulatory Visit | Attending: Family Medicine | Admitting: Family Medicine

## 2022-03-22 VITALS — BP 98/64 | HR 75 | Temp 97.7°F | Resp 18 | Ht 69.0 in | Wt 120.8 lb

## 2022-03-22 DIAGNOSIS — Z0000 Encounter for general adult medical examination without abnormal findings: Secondary | ICD-10-CM | POA: Diagnosis not present

## 2022-03-22 DIAGNOSIS — Z130 Encounter for screening for diseases of the blood and blood-forming organs and certain disorders involving the immune mechanism: Secondary | ICD-10-CM | POA: Diagnosis not present

## 2022-03-22 DIAGNOSIS — B3731 Acute candidiasis of vulva and vagina: Secondary | ICD-10-CM | POA: Diagnosis not present

## 2022-03-22 DIAGNOSIS — Z131 Encounter for screening for diabetes mellitus: Secondary | ICD-10-CM | POA: Diagnosis not present

## 2022-03-22 DIAGNOSIS — S060X0A Concussion without loss of consciousness, initial encounter: Secondary | ICD-10-CM | POA: Diagnosis not present

## 2022-03-22 DIAGNOSIS — Z124 Encounter for screening for malignant neoplasm of cervix: Secondary | ICD-10-CM | POA: Insufficient documentation

## 2022-03-22 DIAGNOSIS — Z043 Encounter for examination and observation following other accident: Secondary | ICD-10-CM | POA: Diagnosis not present

## 2022-03-22 DIAGNOSIS — Z1329 Encounter for screening for other suspected endocrine disorder: Secondary | ICD-10-CM | POA: Diagnosis not present

## 2022-03-22 DIAGNOSIS — S161XXA Strain of muscle, fascia and tendon at neck level, initial encounter: Secondary | ICD-10-CM

## 2022-03-22 DIAGNOSIS — Z1159 Encounter for screening for other viral diseases: Secondary | ICD-10-CM | POA: Diagnosis not present

## 2022-03-22 DIAGNOSIS — Z13220 Encounter for screening for lipoid disorders: Secondary | ICD-10-CM | POA: Diagnosis not present

## 2022-03-22 LAB — COMPREHENSIVE METABOLIC PANEL
ALT: 23 U/L (ref 0–35)
AST: 23 U/L (ref 0–37)
Albumin: 4.6 g/dL (ref 3.5–5.2)
Alkaline Phosphatase: 47 U/L (ref 39–117)
BUN: 12 mg/dL (ref 6–23)
CO2: 29 mEq/L (ref 19–32)
Calcium: 9.7 mg/dL (ref 8.4–10.5)
Chloride: 103 mEq/L (ref 96–112)
Creatinine, Ser: 0.93 mg/dL (ref 0.40–1.20)
GFR: 78.46 mL/min (ref >60.00)
Glucose, Bld: 65 mg/dL — ABNORMAL LOW (ref 70–99)
Potassium: 3.9 mEq/L (ref 3.5–5.1)
Sodium: 141 mEq/L (ref 135–145)
Total Bilirubin: 0.8 mg/dL (ref 0.2–1.2)
Total Protein: 7.3 g/dL (ref 6.0–8.3)

## 2022-03-22 LAB — CBC
HCT: 42.4 % (ref 36.0–46.0)
Hemoglobin: 14.1 g/dL (ref 12.0–15.0)
MCHC: 33.3 g/dL (ref 30.0–36.0)
MCV: 92.8 fl (ref 78.0–100.0)
Platelets: 217 10*3/uL (ref 150.0–400.0)
RBC: 4.57 Mil/uL (ref 3.87–5.11)
RDW: 13.3 % (ref 11.5–15.5)
WBC: 4.7 10*3/uL (ref 4.0–10.5)

## 2022-03-22 LAB — TSH: TSH: 2.78 u[IU]/mL (ref 0.35–5.50)

## 2022-03-22 LAB — LIPID PANEL
Cholesterol: 163 mg/dL (ref 0–200)
HDL: 58.8 mg/dL (ref >39.00)
LDL Cholesterol: 88 mg/dL (ref 0–99)
NonHDL: 104.35
Total CHOL/HDL Ratio: 3
Triglycerides: 80 mg/dL (ref 0.0–149.0)
VLDL: 16 mg/dL (ref 0.0–40.0)

## 2022-03-22 LAB — HEMOGLOBIN A1C: Hgb A1c MFr Bld: 5.4 % (ref 4.6–6.5)

## 2022-03-22 MED ORDER — FLUCONAZOLE 150 MG PO TABS
150.0000 mg | ORAL_TABLET | Freq: Once | ORAL | 0 refills | Status: AC
  Filled 2022-03-22: qty 2, 2d supply, fill #0

## 2022-03-22 MED ORDER — CYCLOBENZAPRINE HCL 10 MG PO TABS
10.0000 mg | ORAL_TABLET | Freq: Two times a day (BID) | ORAL | 0 refills | Status: DC | PRN
  Filled 2022-03-22: qty 20, 10d supply, fill #0

## 2022-03-23 ENCOUNTER — Encounter: Payer: Self-pay | Admitting: Family Medicine

## 2022-03-23 LAB — CYTOLOGY - PAP
Comment: NEGATIVE
Diagnosis: NEGATIVE
High risk HPV: NEGATIVE

## 2022-03-23 LAB — HEPATITIS C ANTIBODY: Hepatitis C Ab: NONREACTIVE

## 2022-07-10 ENCOUNTER — Other Ambulatory Visit: Payer: Self-pay | Admitting: Family Medicine

## 2022-07-10 ENCOUNTER — Other Ambulatory Visit (HOSPITAL_COMMUNITY): Payer: Self-pay

## 2022-07-10 MED ORDER — ESCITALOPRAM OXALATE 10 MG PO TABS
10.0000 mg | ORAL_TABLET | Freq: Every day | ORAL | 1 refills | Status: DC
  Filled 2022-07-10: qty 90, 90d supply, fill #0
  Filled 2023-05-10: qty 90, 90d supply, fill #1

## 2022-09-20 ENCOUNTER — Telehealth: Payer: Commercial Managed Care - PPO | Admitting: Physician Assistant

## 2022-09-20 DIAGNOSIS — B9689 Other specified bacterial agents as the cause of diseases classified elsewhere: Secondary | ICD-10-CM

## 2022-09-20 DIAGNOSIS — J019 Acute sinusitis, unspecified: Secondary | ICD-10-CM | POA: Diagnosis not present

## 2022-09-20 MED ORDER — BENZONATATE 100 MG PO CAPS: 100.0000 mg | ORAL_CAPSULE | Freq: Three times a day (TID) | ORAL | 0 refills | Status: DC | PRN

## 2022-09-20 MED ORDER — AMOXICILLIN-POT CLAVULANATE 875-125 MG PO TABS: 1.0000 | ORAL_TABLET | Freq: Two times a day (BID) | ORAL | 0 refills | Status: DC

## 2022-09-20 MED ORDER — DOXYCYCLINE HYCLATE 100 MG PO TABS: 100.0000 mg | ORAL_TABLET | Freq: Two times a day (BID) | ORAL | 0 refills | Status: DC

## 2022-09-20 NOTE — Addendum Note (Signed)
Addended by: Brunetta Jeans on: 09/20/2022 02:55 PM   Modules accepted: Orders

## 2022-09-20 NOTE — Progress Notes (Signed)
I have spent 5 minutes in review of e-visit questionnaire, review and updating patient chart, medical decision making and response to patient.   Santita Hunsberger Cody Mateo Overbeck, PA-C    

## 2022-09-20 NOTE — Progress Notes (Signed)
E-Visit for Sinus Problems  We are sorry that you are not feeling well.  Here is how we plan to help!  Based on what you have shared with me it looks like you have sinusitis.  Sinusitis is inflammation and infection in the sinus cavities of the head.  Based on your presentation I believe you most likely have Acute Bacterial Sinusitis.  This is an infection caused by bacteria and is treated with antibiotics. I have prescribed Augmentin '875mg'$ /'125mg'$  one tablet twice daily with food, for 7 days. You may use an oral decongestant such as Mucinex D or if you have glaucoma or high blood pressure use plain Mucinex. Saline nasal spray help and can safely be used as often as needed for congestion. I have also sent in a prescription cough medication you can take with the OTC medications.  If you develop worsening sinus pain, fever or notice severe headache and vision changes, or if symptoms are not better after completion of antibiotic, please schedule an appointment with a health care provider.    Sinus infections are not as easily transmitted as other respiratory infection, however we still recommend that you avoid close contact with loved ones, especially the very young and elderly.  Remember to wash your hands thoroughly throughout the day as this is the number one way to prevent the spread of infection!  Home Care: Only take medications as instructed by your medical team. Complete the entire course of an antibiotic. Do not take these medications with alcohol. A steam or ultrasonic humidifier can help congestion.  You can place a towel over your head and breathe in the steam from hot water coming from a faucet. Avoid close contacts especially the very young and the elderly. Cover your mouth when you cough or sneeze. Always remember to wash your hands.  Get Help Right Away If: You develop worsening fever or sinus pain. You develop a severe head ache or visual changes. Your symptoms persist after you have  completed your treatment plan.  Make sure you Understand these instructions. Will watch your condition. Will get help right away if you are not doing well or get worse.  Thank you for choosing an e-visit.  Your e-visit answers were reviewed by a board certified advanced clinical practitioner to complete your personal care plan. Depending upon the condition, your plan could have included both over the counter or prescription medications.  Please review your pharmacy choice. Make sure the pharmacy is open so you can pick up prescription now. If there is a problem, you may contact your provider through [employer] and have the prescription routed to another pharmacy.  Your safety is important to [place]. If you have drug allergies check your prescription carefully.   For the next 24 hours you can use MyChart to ask questions about today's visit, request a non-urgent call back, or ask for a work or school excuse. You will get an email in the next two days asking about your experience. I hope that your e-visit has been valuable and will speed your recovery.

## 2022-11-03 DIAGNOSIS — Z3200 Encounter for pregnancy test, result unknown: Secondary | ICD-10-CM | POA: Diagnosis not present

## 2022-11-03 DIAGNOSIS — O0901 Supervision of pregnancy with history of infertility, first trimester: Secondary | ICD-10-CM | POA: Diagnosis not present

## 2022-11-03 DIAGNOSIS — Z3689 Encounter for other specified antenatal screening: Secondary | ICD-10-CM | POA: Diagnosis not present

## 2022-11-06 DIAGNOSIS — Z3A00 Weeks of gestation of pregnancy not specified: Secondary | ICD-10-CM | POA: Diagnosis not present

## 2022-11-06 DIAGNOSIS — O0901 Supervision of pregnancy with history of infertility, first trimester: Secondary | ICD-10-CM | POA: Diagnosis not present

## 2022-11-10 DIAGNOSIS — O209 Hemorrhage in early pregnancy, unspecified: Secondary | ICD-10-CM | POA: Diagnosis not present

## 2022-11-10 DIAGNOSIS — O0901 Supervision of pregnancy with history of infertility, first trimester: Secondary | ICD-10-CM | POA: Diagnosis not present

## 2022-11-24 DIAGNOSIS — Z3201 Encounter for pregnancy test, result positive: Secondary | ICD-10-CM | POA: Diagnosis not present

## 2022-12-08 DIAGNOSIS — Z3689 Encounter for other specified antenatal screening: Secondary | ICD-10-CM | POA: Diagnosis not present

## 2022-12-08 DIAGNOSIS — Z118 Encounter for screening for other infectious and parasitic diseases: Secondary | ICD-10-CM | POA: Diagnosis not present

## 2022-12-08 DIAGNOSIS — Z3480 Encounter for supervision of other normal pregnancy, unspecified trimester: Secondary | ICD-10-CM | POA: Diagnosis not present

## 2022-12-08 DIAGNOSIS — O09521 Supervision of elderly multigravida, first trimester: Secondary | ICD-10-CM | POA: Diagnosis not present

## 2023-01-05 ENCOUNTER — Other Ambulatory Visit (HOSPITAL_BASED_OUTPATIENT_CLINIC_OR_DEPARTMENT_OTHER): Payer: Self-pay

## 2023-01-05 MED ORDER — ESCITALOPRAM OXALATE 10 MG PO TABS
10.0000 mg | ORAL_TABLET | Freq: Every day | ORAL | 6 refills | Status: DC
  Filled 2023-01-05: qty 30, 30d supply, fill #0

## 2023-01-05 MED ORDER — BUTALBITAL-APAP-CAFFEINE 50-325-40 MG PO TABS
1.0000 | ORAL_TABLET | ORAL | 0 refills | Status: DC | PRN
  Filled 2023-01-05: qty 30, 5d supply, fill #0

## 2023-02-09 DIAGNOSIS — Z3A14 14 weeks gestation of pregnancy: Secondary | ICD-10-CM | POA: Diagnosis not present

## 2023-02-09 DIAGNOSIS — O09521 Supervision of elderly multigravida, first trimester: Secondary | ICD-10-CM | POA: Diagnosis not present

## 2023-02-09 DIAGNOSIS — Z361 Encounter for antenatal screening for raised alphafetoprotein level: Secondary | ICD-10-CM | POA: Diagnosis not present

## 2023-02-09 DIAGNOSIS — Z3A19 19 weeks gestation of pregnancy: Secondary | ICD-10-CM | POA: Diagnosis not present

## 2023-02-09 DIAGNOSIS — O09522 Supervision of elderly multigravida, second trimester: Secondary | ICD-10-CM | POA: Diagnosis not present

## 2023-03-26 ENCOUNTER — Encounter: Payer: 59 | Admitting: Family Medicine

## 2023-04-06 DIAGNOSIS — Z3689 Encounter for other specified antenatal screening: Secondary | ICD-10-CM | POA: Diagnosis not present

## 2023-04-06 DIAGNOSIS — Z3A27 27 weeks gestation of pregnancy: Secondary | ICD-10-CM | POA: Diagnosis not present

## 2023-04-06 DIAGNOSIS — O09529 Supervision of elderly multigravida, unspecified trimester: Secondary | ICD-10-CM | POA: Diagnosis not present

## 2023-04-16 ENCOUNTER — Other Ambulatory Visit (HOSPITAL_BASED_OUTPATIENT_CLINIC_OR_DEPARTMENT_OTHER): Payer: Self-pay

## 2023-04-16 MED ORDER — PAXLOVID (300/100) 20 X 150 MG & 10 X 100MG PO TBPK
3.0000 | ORAL_TABLET | Freq: Two times a day (BID) | ORAL | 0 refills | Status: DC
  Filled 2023-04-16: qty 30, 5d supply, fill #0

## 2023-04-24 ENCOUNTER — Other Ambulatory Visit (HOSPITAL_BASED_OUTPATIENT_CLINIC_OR_DEPARTMENT_OTHER): Payer: Self-pay

## 2023-04-24 MED ORDER — NYSTATIN 100000 UNIT/GM EX CREA
TOPICAL_CREAM | CUTANEOUS | 1 refills | Status: DC
  Filled 2023-04-24: qty 30, 25d supply, fill #0

## 2023-05-04 DIAGNOSIS — Z23 Encounter for immunization: Secondary | ICD-10-CM | POA: Diagnosis not present

## 2023-05-04 DIAGNOSIS — O09523 Supervision of elderly multigravida, third trimester: Secondary | ICD-10-CM | POA: Diagnosis not present

## 2023-05-04 DIAGNOSIS — Z3A31 31 weeks gestation of pregnancy: Secondary | ICD-10-CM | POA: Diagnosis not present

## 2023-05-04 DIAGNOSIS — Z3689 Encounter for other specified antenatal screening: Secondary | ICD-10-CM | POA: Diagnosis not present

## 2023-05-11 ENCOUNTER — Encounter: Payer: Self-pay | Admitting: Pharmacist

## 2023-05-11 ENCOUNTER — Other Ambulatory Visit: Payer: Self-pay

## 2023-05-15 ENCOUNTER — Other Ambulatory Visit: Payer: Self-pay

## 2023-05-18 DIAGNOSIS — Z3A33 33 weeks gestation of pregnancy: Secondary | ICD-10-CM | POA: Diagnosis not present

## 2023-05-18 DIAGNOSIS — O09523 Supervision of elderly multigravida, third trimester: Secondary | ICD-10-CM | POA: Diagnosis not present

## 2023-05-25 DIAGNOSIS — O359XX0 Maternal care for (suspected) fetal abnormality and damage, unspecified, not applicable or unspecified: Secondary | ICD-10-CM | POA: Diagnosis not present

## 2023-05-28 ENCOUNTER — Other Ambulatory Visit (HOSPITAL_BASED_OUTPATIENT_CLINIC_OR_DEPARTMENT_OTHER): Payer: Self-pay

## 2023-05-28 MED ORDER — HYDROXYZINE HCL 25 MG PO TABS
25.0000 mg | ORAL_TABLET | Freq: Three times a day (TID) | ORAL | 0 refills | Status: DC | PRN
  Filled 2023-05-28: qty 90, 30d supply, fill #0

## 2023-05-28 MED ORDER — ESCITALOPRAM OXALATE 20 MG PO TABS
20.0000 mg | ORAL_TABLET | Freq: Every day | ORAL | 3 refills | Status: DC
  Filled 2023-05-28: qty 90, 90d supply, fill #0
  Filled 2023-09-11: qty 90, 90d supply, fill #1
  Filled 2023-09-18: qty 90, 90d supply, fill #0
  Filled 2023-12-31: qty 90, 90d supply, fill #1

## 2023-05-31 ENCOUNTER — Encounter: Payer: Self-pay | Admitting: Family Medicine

## 2023-06-08 DIAGNOSIS — O09523 Supervision of elderly multigravida, third trimester: Secondary | ICD-10-CM | POA: Diagnosis not present

## 2023-06-08 DIAGNOSIS — Z3A36 36 weeks gestation of pregnancy: Secondary | ICD-10-CM | POA: Diagnosis not present

## 2023-06-08 DIAGNOSIS — Z3683 Encounter for fetal screening for congenital cardiac abnormalities: Secondary | ICD-10-CM | POA: Diagnosis not present

## 2023-06-08 DIAGNOSIS — O358XX0 Maternal care for other (suspected) fetal abnormality and damage, not applicable or unspecified: Secondary | ICD-10-CM | POA: Diagnosis not present

## 2023-06-08 DIAGNOSIS — O35BXX0 Maternal care for other (suspected) fetal abnormality and damage, fetal cardiac anomalies, not applicable or unspecified: Secondary | ICD-10-CM | POA: Diagnosis not present

## 2023-06-11 DIAGNOSIS — Z3483 Encounter for supervision of other normal pregnancy, third trimester: Secondary | ICD-10-CM | POA: Diagnosis not present

## 2023-06-11 DIAGNOSIS — Z3482 Encounter for supervision of other normal pregnancy, second trimester: Secondary | ICD-10-CM | POA: Diagnosis not present

## 2023-06-12 DIAGNOSIS — O358XX0 Maternal care for other (suspected) fetal abnormality and damage, not applicable or unspecified: Secondary | ICD-10-CM | POA: Diagnosis not present

## 2023-06-15 DIAGNOSIS — Q631 Lobulated, fused and horseshoe kidney: Secondary | ICD-10-CM | POA: Diagnosis not present

## 2023-06-15 DIAGNOSIS — O99343 Other mental disorders complicating pregnancy, third trimester: Secondary | ICD-10-CM | POA: Diagnosis not present

## 2023-06-15 DIAGNOSIS — O09529 Supervision of elderly multigravida, unspecified trimester: Secondary | ICD-10-CM | POA: Diagnosis not present

## 2023-06-15 DIAGNOSIS — O09523 Supervision of elderly multigravida, third trimester: Secondary | ICD-10-CM | POA: Diagnosis not present

## 2023-06-15 DIAGNOSIS — O35EXX0 Maternal care for other (suspected) fetal abnormality and damage, fetal genitourinary anomalies, not applicable or unspecified: Secondary | ICD-10-CM | POA: Diagnosis not present

## 2023-06-15 DIAGNOSIS — O368999 Maternal care for other specified fetal problems, unspecified trimester, other fetus: Secondary | ICD-10-CM | POA: Diagnosis not present

## 2023-06-15 DIAGNOSIS — F418 Other specified anxiety disorders: Secondary | ICD-10-CM | POA: Diagnosis not present

## 2023-06-15 DIAGNOSIS — Z3A37 37 weeks gestation of pregnancy: Secondary | ICD-10-CM | POA: Diagnosis not present

## 2023-06-15 DIAGNOSIS — O99891 Other specified diseases and conditions complicating pregnancy: Secondary | ICD-10-CM | POA: Diagnosis not present

## 2023-06-22 DIAGNOSIS — O09523 Supervision of elderly multigravida, third trimester: Secondary | ICD-10-CM | POA: Diagnosis not present

## 2023-06-22 DIAGNOSIS — O368930 Maternal care for other specified fetal problems, third trimester, not applicable or unspecified: Secondary | ICD-10-CM | POA: Diagnosis not present

## 2023-06-22 DIAGNOSIS — Z3A38 38 weeks gestation of pregnancy: Secondary | ICD-10-CM | POA: Diagnosis not present

## 2023-06-28 DIAGNOSIS — F418 Other specified anxiety disorders: Secondary | ICD-10-CM | POA: Diagnosis not present

## 2023-06-28 DIAGNOSIS — O35BXX0 Maternal care for other (suspected) fetal abnormality and damage, fetal cardiac anomalies, not applicable or unspecified: Secondary | ICD-10-CM | POA: Diagnosis not present

## 2023-06-28 DIAGNOSIS — O09523 Supervision of elderly multigravida, third trimester: Secondary | ICD-10-CM | POA: Diagnosis not present

## 2023-06-28 DIAGNOSIS — O99892 Other specified diseases and conditions complicating childbirth: Secondary | ICD-10-CM | POA: Diagnosis not present

## 2023-06-28 DIAGNOSIS — Q249 Congenital malformation of heart, unspecified: Secondary | ICD-10-CM | POA: Diagnosis not present

## 2023-06-28 DIAGNOSIS — O99344 Other mental disorders complicating childbirth: Secondary | ICD-10-CM | POA: Diagnosis not present

## 2023-06-28 DIAGNOSIS — O99343 Other mental disorders complicating pregnancy, third trimester: Secondary | ICD-10-CM | POA: Diagnosis not present

## 2023-06-28 DIAGNOSIS — Z4682 Encounter for fitting and adjustment of non-vascular catheter: Secondary | ICD-10-CM | POA: Diagnosis not present

## 2023-06-28 DIAGNOSIS — Z452 Encounter for adjustment and management of vascular access device: Secondary | ICD-10-CM | POA: Diagnosis not present

## 2023-06-28 DIAGNOSIS — O99345 Other mental disorders complicating the puerperium: Secondary | ICD-10-CM | POA: Diagnosis not present

## 2023-06-28 DIAGNOSIS — Q248 Other specified congenital malformations of heart: Secondary | ICD-10-CM | POA: Diagnosis not present

## 2023-06-28 DIAGNOSIS — F419 Anxiety disorder, unspecified: Secondary | ICD-10-CM | POA: Diagnosis not present

## 2023-06-28 DIAGNOSIS — F32A Depression, unspecified: Secondary | ICD-10-CM | POA: Diagnosis not present

## 2023-06-28 DIAGNOSIS — O9081 Anemia of the puerperium: Secondary | ICD-10-CM | POA: Diagnosis not present

## 2023-06-28 DIAGNOSIS — G9389 Other specified disorders of brain: Secondary | ICD-10-CM | POA: Diagnosis not present

## 2023-06-28 DIAGNOSIS — Z3A39 39 weeks gestation of pregnancy: Secondary | ICD-10-CM | POA: Diagnosis not present

## 2023-06-28 DIAGNOSIS — O358XX0 Maternal care for other (suspected) fetal abnormality and damage, not applicable or unspecified: Secondary | ICD-10-CM | POA: Diagnosis not present

## 2023-06-28 DIAGNOSIS — D62 Acute posthemorrhagic anemia: Secondary | ICD-10-CM | POA: Diagnosis not present

## 2023-06-28 DIAGNOSIS — O35EXX0 Maternal care for other (suspected) fetal abnormality and damage, fetal genitourinary anomalies, not applicable or unspecified: Secondary | ICD-10-CM | POA: Diagnosis not present

## 2023-06-28 DIAGNOSIS — Q631 Lobulated, fused and horseshoe kidney: Secondary | ICD-10-CM | POA: Diagnosis not present

## 2023-07-02 ENCOUNTER — Other Ambulatory Visit (HOSPITAL_BASED_OUTPATIENT_CLINIC_OR_DEPARTMENT_OTHER): Payer: Self-pay

## 2023-07-02 MED ORDER — POLYETHYLENE GLYCOL 3350 17 G PO PACK
17.0000 g | PACK | Freq: Every day | ORAL | 0 refills | Status: DC
  Filled 2023-07-02: qty 10, 10d supply, fill #0

## 2023-07-12 ENCOUNTER — Other Ambulatory Visit (HOSPITAL_BASED_OUTPATIENT_CLINIC_OR_DEPARTMENT_OTHER): Payer: Self-pay

## 2023-08-29 ENCOUNTER — Telehealth: Payer: Commercial Managed Care - PPO | Admitting: Physician Assistant

## 2023-08-29 DIAGNOSIS — R3989 Other symptoms and signs involving the genitourinary system: Secondary | ICD-10-CM | POA: Diagnosis not present

## 2023-08-29 MED ORDER — CEPHALEXIN 500 MG PO CAPS: 500.0000 mg | ORAL_CAPSULE | Freq: Two times a day (BID) | ORAL | 0 refills | Status: AC

## 2023-08-29 NOTE — Progress Notes (Signed)

## 2023-08-29 NOTE — Progress Notes (Signed)
 I have spent 5 minutes in review of e-visit questionnaire, review and updating patient chart, medical decision making and response to patient.   Piedad Climes, PA-C

## 2023-09-12 ENCOUNTER — Other Ambulatory Visit: Payer: Self-pay

## 2023-09-12 ENCOUNTER — Encounter: Payer: Self-pay | Admitting: Pharmacist

## 2023-09-17 ENCOUNTER — Other Ambulatory Visit: Payer: Self-pay

## 2023-09-18 ENCOUNTER — Other Ambulatory Visit (HOSPITAL_COMMUNITY): Payer: Self-pay

## 2023-10-26 ENCOUNTER — Other Ambulatory Visit (HOSPITAL_COMMUNITY): Payer: Self-pay

## 2023-11-01 DIAGNOSIS — G40109 Localization-related (focal) (partial) symptomatic epilepsy and epileptic syndromes with simple partial seizures, not intractable, without status epilepticus: Secondary | ICD-10-CM | POA: Diagnosis not present

## 2023-11-01 DIAGNOSIS — Q851 Tuberous sclerosis: Secondary | ICD-10-CM | POA: Diagnosis not present

## 2023-11-01 DIAGNOSIS — D151 Benign neoplasm of heart: Secondary | ICD-10-CM | POA: Diagnosis not present

## 2023-11-01 DIAGNOSIS — Z79899 Other long term (current) drug therapy: Secondary | ICD-10-CM | POA: Diagnosis not present

## 2023-11-01 DIAGNOSIS — Z1152 Encounter for screening for COVID-19: Secondary | ICD-10-CM | POA: Diagnosis not present

## 2023-11-01 DIAGNOSIS — Q631 Lobulated, fused and horseshoe kidney: Secondary | ICD-10-CM | POA: Diagnosis not present

## 2023-11-09 ENCOUNTER — Other Ambulatory Visit (HOSPITAL_BASED_OUTPATIENT_CLINIC_OR_DEPARTMENT_OTHER): Payer: Self-pay

## 2023-11-09 ENCOUNTER — Telehealth: Admitting: Family Medicine

## 2023-11-09 DIAGNOSIS — B9689 Other specified bacterial agents as the cause of diseases classified elsewhere: Secondary | ICD-10-CM | POA: Diagnosis not present

## 2023-11-09 DIAGNOSIS — J019 Acute sinusitis, unspecified: Secondary | ICD-10-CM

## 2023-11-09 MED ORDER — AMOXICILLIN-POT CLAVULANATE 875-125 MG PO TABS
1.0000 | ORAL_TABLET | Freq: Two times a day (BID) | ORAL | 0 refills | Status: DC
  Filled 2023-11-09: qty 20, 10d supply, fill #0

## 2023-11-09 NOTE — Progress Notes (Signed)

## 2023-12-31 ENCOUNTER — Other Ambulatory Visit (HOSPITAL_COMMUNITY): Payer: Self-pay

## 2024-01-03 ENCOUNTER — Ambulatory Visit
Admission: RE | Admit: 2024-01-03 | Discharge: 2024-01-03 | Disposition: A | Discharge status: Home / Self Care | Source: Ambulatory Visit | Attending: Family Medicine | Admitting: Family Medicine

## 2024-01-03 VITALS — BP 118/76 | HR 97 | Temp 99.3°F | Resp 17

## 2024-01-03 DIAGNOSIS — R42 Dizziness and giddiness: Secondary | ICD-10-CM

## 2024-01-03 DIAGNOSIS — A084 Viral intestinal infection, unspecified: Secondary | ICD-10-CM

## 2024-01-03 DIAGNOSIS — R52 Pain, unspecified: Secondary | ICD-10-CM | POA: Diagnosis not present

## 2024-01-03 DIAGNOSIS — R112 Nausea with vomiting, unspecified: Secondary | ICD-10-CM | POA: Diagnosis not present

## 2024-01-03 DIAGNOSIS — I959 Hypotension, unspecified: Secondary | ICD-10-CM | POA: Diagnosis not present

## 2024-01-03 DIAGNOSIS — R55 Syncope and collapse: Secondary | ICD-10-CM

## 2024-01-03 DIAGNOSIS — R064 Hyperventilation: Secondary | ICD-10-CM | POA: Diagnosis not present

## 2024-01-03 DIAGNOSIS — I951 Orthostatic hypotension: Secondary | ICD-10-CM

## 2024-01-03 DIAGNOSIS — R509 Fever, unspecified: Secondary | ICD-10-CM | POA: Diagnosis not present

## 2024-01-03 DIAGNOSIS — E860 Dehydration: Secondary | ICD-10-CM

## 2024-01-03 MED ORDER — ONDANSETRON HCL 4 MG/2ML IJ SOLN: 4.0000 mg | Freq: Once | INTRAMUSCULAR | Status: DC

## 2024-01-03 MED ORDER — ONDANSETRON 4 MG PO TBDP
4.0000 mg | ORAL_TABLET | Freq: Three times a day (TID) | ORAL | 0 refills | Status: DC | PRN
  Filled 2024-01-03: qty 20, 7d supply, fill #0

## 2024-01-03 MED ORDER — KETOROLAC TROMETHAMINE 30 MG/ML IJ SOLN
30.0000 mg | Freq: Once | INTRAMUSCULAR | Status: AC
  Administered 2024-01-03: 30 mg via INTRAVENOUS

## 2024-01-03 MED ORDER — ONDANSETRON HCL 4 MG/2ML IJ SOLN
4.0000 mg | Freq: Once | INTRAMUSCULAR | Status: AC
  Administered 2024-01-03: 4 mg via INTRAVENOUS

## 2024-01-03 MED ORDER — SODIUM CHLORIDE 0.9 % IV BOLUS
1000.0000 mL | Freq: Once | INTRAVENOUS | Status: AC
  Administered 2024-01-03: 1000 mL via INTRAVENOUS

## 2024-01-03 MED ORDER — ONDANSETRON 4 MG PO TBDP
4.0000 mg | ORAL_TABLET | Freq: Once | ORAL | Status: AC
  Administered 2024-01-03: 4 mg via ORAL

## 2024-01-03 NOTE — Discharge Instructions (Signed)

## 2024-01-03 NOTE — ED Provider Notes (Signed)
 [facility]    CSN: [id number] Arrival date & time: 01/03/24  1745      History   Chief Complaint Chief Complaint  Patient presents with   Diarrhea    Diarrhea all day, dehydration, vomiting and unable to keep fluids down in stomach- just vomitted up all pedialyte - Entered by patient   Emesis    HPI Debbie Harris is a 38 y.o. female.   Patient presents to urgent care for evaluation of nausea, vomiting, diarrhea, fatigue, and dizziness that started last night/this morning. She went to work today where she began feeling ill with nausea and diarrhea. She took Tums and Imodium  without relief. She was able to leave work early, went home, and had multiple episodes of nausea, vomiting, and diarrhea. No blood in output. She had an unwitnessed syncopal event at home this afternoon where she passed out on the floor of her bathroom. She did not hit her head and denies chest pain preceding event. EMS came to the house to evaluate her and found BP to be 76/palp. She was advised by EMS to go to the ER for fluids and labs.  She was able to drive herself to urgent care where she presents with dizziness, forehead headache (she attributes this to lack of caffeine  today), and fatigue. Denies vision changes, chest pain, palpitations, leg swelling, fever, and viral URI symptoms. Dizziness is worsened with standing and position changes.  Her young child recently became with with the "stomach flu" and she believes she has the same.  She has been drinking pedialyte but reports she is unable to keep this down without throwing up.      Diarrhea Associated symptoms: vomiting   Emesis Associated symptoms: diarrhea     Past Medical History:  Diagnosis Date   Anxiety    [employer] 12/08/2020   Depression     Patient Active Problem List   Diagnosis Date Noted   Bronchitis 06/06/2021   Pregnant and not yet delivered 01/14/2021   Counseling of expectant parents at pediatric pre-birth visit  12/11/2020    Past Surgical History:  Procedure Laterality Date   BREAST ENHANCEMENT SURGERY Bilateral 2018   BREAST SURGERY N/A    [employer]reesia 12/08/2020   URETER SURGERY  1992    OB History     Gravida  1   Para  1   Term  1   Preterm      AB      Living  1      SAB      IAB      Ectopic      Multiple  0   Live Births  1            Home Medications    Prior to Admission medications   Medication Sig Start Date End Date Taking? Authorizing Provider  ondansetron  (ZOFRAN -ODT) 4 MG disintegrating tablet Take 1 tablet (4 mg total) by mouth every 8 (eight) hours as needed for nausea or vomiting. 01/03/24  Yes Starlene Eaton, FNP  amoxicillin -clavulanate (AUGMENTIN ) 875-125 MG tablet Take 1 tablet by mouth 2 (two) times daily. Patient not taking: Reported on 01/03/2024 11/09/23   Blair, Diane W, FNP  escitalopram  (LEXAPRO ) 20 MG tablet Take 1 tablet (20 mg total) by mouth daily. 05/28/23       Family History Family History  Problem Relation Age of Onset   Hypertension Father    Hyperlipidemia Father    Bladder Cancer Father    Breast  cancer Maternal Grandmother     Social History Social History   Tobacco Use   Smoking status: Never   Smokeless tobacco: Never  Vaping Use   Vaping status: Never Used  Substance Use Topics   Alcohol use: Not Currently    Comment: social   Drug use: No     Allergies   Sulfa antibiotics   Review of Systems Review of Systems  Gastrointestinal:  Positive for diarrhea and vomiting.  Per HPI   Physical Exam Triage Vital Signs ED Triage Vitals  Encounter Vitals Group     BP 01/03/24 1747 126/85     Systolic BP Percentile --      Diastolic BP Percentile --      Pulse Rate 01/03/24 1747 99     Resp 01/03/24 1747 17     Temp 01/03/24 1747 99.3 F (37.4 C)     Temp Source 01/03/24 1747 Oral     SpO2 01/03/24 1747 98 %     Weight --      Height --      Head Circumference --      Peak Flow --      Pain  Score 01/03/24 1756 2     Pain Loc --      Pain Education --      Exclude from Growth Chart --    No data found.  Updated Vital Signs BP 118/76 (BP Location: Right Arm)   Pulse 97   Temp 99.3 F (37.4 C) (Oral)   Resp 17   LMP 12/25/2023 (Exact Date)   SpO2 98%   Visual Acuity Right Eye Distance:   Left Eye Distance:   Bilateral Distance:    Right Eye Near:   Left Eye Near:    Bilateral Near:     Physical Exam Vitals and nursing note reviewed.  Constitutional:      Appearance: She is ill-appearing. She is not toxic-appearing.     Comments: Ill-appearing with clammy skin on initial assessment.   HENT:     Head: Normocephalic and atraumatic. No raccoon eyes, Battle's sign, right periorbital erythema or left periorbital erythema.     Jaw: There is normal jaw occlusion.     Comments: Negative battle's sign, no racoon eyes.     Right Ear: Hearing and external ear normal.     Left Ear: Hearing and external ear normal.     Nose: Nose normal.     Mouth/Throat:     Lips: Pink.     Mouth: Mucous membranes are dry. No injury or oral lesions.     Dentition: Normal dentition.     Tongue: No lesions.     Pharynx: Oropharynx is clear. Uvula midline. No pharyngeal swelling, oropharyngeal exudate, posterior oropharyngeal erythema, uvula swelling or postnasal drip.     Tonsils: No tonsillar exudate.  Eyes:     General: Lids are normal. Vision grossly intact. Gaze aligned appropriately.     Extraocular Movements: Extraocular movements intact.     Conjunctiva/sclera: Conjunctivae normal.  Neck:     Trachea: Trachea and phonation normal.  Cardiovascular:     Rate and Rhythm: Normal rate and regular rhythm.     Pulses:          Radial pulses are 2+ on the right side and 2+ on the left side.     Heart sounds: Normal heart sounds, S1 normal and S2 normal.  Pulmonary:     Effort: Pulmonary effort is normal. No respiratory  distress.     Breath sounds: Normal breath sounds and air  entry.  Abdominal:     General: Abdomen is flat. Bowel sounds are normal.     Palpations: Abdomen is soft.     Tenderness: There is no abdominal tenderness. There is no right CVA tenderness, left CVA tenderness or guarding.     Comments: No peritoneal signs on abdominal exam.   Musculoskeletal:     Cervical back: Neck supple.  Lymphadenopathy:     Cervical: No cervical adenopathy.  Skin:    General: Skin is warm and dry.     Capillary Refill: Capillary refill takes less than 2 seconds.     Findings: No rash.  Neurological:     General: No focal deficit present.     Mental Status: She is alert and oriented to person, place, and time. Mental status is at baseline.     GCS: GCS eye subscore is 4. GCS verbal subscore is 5. GCS motor subscore is 6.     Cranial Nerves: Cranial nerves 2-12 are intact. No dysarthria or facial asymmetry.     Sensory: Sensation is intact.     Motor: Motor function is intact. No weakness, tremor, abnormal muscle tone or pronator drift.     Coordination: Coordination is intact. Romberg sign negative. Coordination normal. Finger-Nose-Finger Test normal.     Gait: Gait is intact.     Comments: Strength and sensation intact to bilateral upper and lower extremities (5/5). Moves all 4 extremities with normal coordination voluntarily. Non-focal neuro exam.   Psychiatric:        Mood and Affect: Mood normal.        Speech: Speech normal.        Behavior: Behavior normal.        Thought Content: Thought content normal.        Judgment: Judgment normal.      UC Treatments / Results  Labs (all labs ordered are listed, but only abnormal results are displayed) Labs Reviewed - No data to display  EKG   Radiology No results found.  Procedures Procedures (including critical care time)  Medications Ordered in UC Medications  ondansetron  (ZOFRAN -ODT) disintegrating tablet 4 mg (4 mg Oral Given 01/03/24 1758)  sodium chloride  0.9 % bolus 1,000 mL (0 mLs  Intravenous Stopped 01/03/24 1934)  ketorolac  (TORADOL ) 30 MG/ML injection 30 mg (30 mg Intravenous Given 01/03/24 1850)  ondansetron  (ZOFRAN ) injection 4 mg (4 mg Intravenous Given 01/03/24 1850)    Initial Impression / Assessment and Plan / UC Course  I have reviewed the triage vital signs and the nursing notes.  Pertinent labs & imaging results that were available during my care of the patient were reviewed by me and considered in my medical decision making (see chart for details).   1. Viral gastroenteritis, postural hypotension, dizziness, vasovagal syncope, dehydration Severe dehydration due to volume depletion from vomiting/diarrhea likely due to norovirus.  This caused vasovagal syncopal episode at home. She is posturally orthostatic here on arrival.  Laying HR 80s, sitting HR 114 initially.  Neurologically intact to baseline currently, no signs of injury to the cranium on exam.  EKG shows normal sinus rhythm without ST/T wave changes.  Headache likely secondary to dehydration.  Low suspicion for ACS, PE, influenza, acute intracranial abnormality, AKI, etc.   IV placed, 1,000ml normal saline bolus started at 1900 and finished at 1934.  Zofran  4mg  ODT given prior to IV placement. Zofran  4mg  IV given after IV placement  as well as ketorolac  30mg  IV.  Exam findings improved significantly throughout and after normal saline bolus.  Headache resolved prior to discharge.  Repeat orthostatic vital signs after normal saline bolus are below: Lying 119/72 68 Sitting 117/72 74 Standing 0 minutes 105/70 86 Standing 3 minutes 115/82 97  We discussed obtaining blood work after normal saline bolus to ensure suspected electrolyte imbalance has resolved, however we used shared decision making to defer this as she is feeling almost fully improved after normal saline bolus.  Ambulatory with steady gait without difficulty at discharge.   Strict ER return precautions discussed should she develop  new/worsening symptoms.  We discussed liquid diet for the next 1-2 hours, then increase to [employer] as tolerated.   Counseled patient on potential for adverse effects with medications prescribed/recommended today, strict ER and return-to-clinic precautions discussed, patient verbalized understanding.    Final Clinical Impressions(s) / UC Diagnoses   Final diagnoses:  Viral gastroenteritis  Postural hypotension  Dizziness  Vasovagal syncope  Dehydration     Discharge Instructions      Your evaluation suggests that your symptoms are most likely due to viral stomach illness (gastroenteritis/"stomach bug") which will improve on its own with rest and fluids in the next few days.   Take zofran  to help with nausea every 8 hours as needed. You may use over the counter medicines for aches and pains such as tylenol  as needed.  Start sipping on liquids (broth, water, gatorade, etc). If you are able to keep liquids down without vomiting for 1-2 hours, you may eat bland foods like jello, pudding, applesauce, bananas, rice, and white toast. Once you can tolerate blands, you may return to normal diet.   Pedialyte or gatorolyte may help to prevent/fix dehydration due to vomiting and diarrhea.  Please follow up with your primary care provider for further management. Return if you experience worsening or uncontrolled pain, inability to tolerate fluids by mouth, difficulty breathing, fevers 100.68F or greater, recurrent vomiting, or any other concerning symptoms.    ED Prescriptions     Medication Sig Dispense Auth. Provider   ondansetron  (ZOFRAN -ODT) 4 MG disintegrating tablet Take 1 tablet (4 mg total) by mouth every 8 (eight) hours as needed for nausea or vomiting. 20 tablet Starlene Eaton, FNP      PDMP not reviewed this encounter.   Starlene Eaton, [place] 01/04/24 1345

## 2024-01-03 NOTE — ED Triage Notes (Addendum)
 Pt c/o nausea, diarrhea, and vomiting that began today. States her child came home with stomach bug this week. Pt states she passed out at home and ended up calling EMS due to body cramping up after passing out. EMS attempted to get an IV but were unable. EMS advised her to go to ER but pt does not want to wait at ER so she began drinking Pedialyte.   Pt is still having trouble keeping fluids down and presents to UC for IV Fluids.

## 2024-01-04 ENCOUNTER — Other Ambulatory Visit (HOSPITAL_BASED_OUTPATIENT_CLINIC_OR_DEPARTMENT_OTHER): Payer: Self-pay

## 2024-01-04 DIAGNOSIS — Q851 Tuberous sclerosis: Secondary | ICD-10-CM | POA: Diagnosis not present

## 2024-01-05 DIAGNOSIS — G40209 Localization-related (focal) (partial) symptomatic epilepsy and epileptic syndromes with complex partial seizures, not intractable, without status epilepticus: Secondary | ICD-10-CM | POA: Diagnosis not present

## 2024-01-05 DIAGNOSIS — Q851 Tuberous sclerosis: Secondary | ICD-10-CM | POA: Diagnosis not present

## 2024-01-06 DIAGNOSIS — G40209 Localization-related (focal) (partial) symptomatic epilepsy and epileptic syndromes with complex partial seizures, not intractable, without status epilepticus: Secondary | ICD-10-CM | POA: Diagnosis not present

## 2024-01-06 DIAGNOSIS — Q851 Tuberous sclerosis: Secondary | ICD-10-CM | POA: Diagnosis not present

## 2024-01-17 ENCOUNTER — Other Ambulatory Visit (HOSPITAL_BASED_OUTPATIENT_CLINIC_OR_DEPARTMENT_OTHER): Payer: Self-pay

## 2024-04-02 ENCOUNTER — Other Ambulatory Visit (HOSPITAL_COMMUNITY): Payer: Self-pay

## 2024-04-02 DIAGNOSIS — F418 Other specified anxiety disorders: Secondary | ICD-10-CM | POA: Diagnosis not present

## 2024-04-02 DIAGNOSIS — Z01411 Encounter for gynecological examination (general) (routine) with abnormal findings: Secondary | ICD-10-CM | POA: Diagnosis not present

## 2024-04-02 DIAGNOSIS — Z1331 Encounter for screening for depression: Secondary | ICD-10-CM | POA: Diagnosis not present

## 2024-04-02 MED ORDER — SERTRALINE HCL 25 MG PO TABS
25.0000 mg | ORAL_TABLET | Freq: Every day | ORAL | 1 refills | Status: DC
  Filled 2024-04-02 (×2): qty 30, 30d supply, fill #0
  Filled 2024-04-28: qty 30, 30d supply, fill #1

## 2024-04-02 MED ORDER — ESCITALOPRAM OXALATE 20 MG PO TABS
20.0000 mg | ORAL_TABLET | Freq: Every day | ORAL | 3 refills | Status: AC
  Filled 2024-04-02 (×2): qty 90, 90d supply, fill #0

## 2024-04-03 ENCOUNTER — Other Ambulatory Visit (HOSPITAL_COMMUNITY): Payer: Self-pay

## 2024-05-26 ENCOUNTER — Other Ambulatory Visit (HOSPITAL_COMMUNITY): Payer: Self-pay

## 2024-05-27 ENCOUNTER — Other Ambulatory Visit (HOSPITAL_COMMUNITY): Payer: Self-pay

## 2024-05-27 ENCOUNTER — Encounter (HOSPITAL_COMMUNITY): Payer: Self-pay

## 2024-06-16 ENCOUNTER — Other Ambulatory Visit (HOSPITAL_COMMUNITY): Payer: Self-pay

## 2024-06-16 MED ORDER — FLUZONE 0.5 ML IM SUSY
0.5000 mL | PREFILLED_SYRINGE | INTRAMUSCULAR | 0 refills | Status: AC
Start: 2024-06-16 — End: ?
  Filled 2024-06-16: qty 0.5, 1d supply, fill #0

## 2024-06-23 ENCOUNTER — Other Ambulatory Visit (HOSPITAL_COMMUNITY): Payer: Self-pay

## 2024-06-23 MED ORDER — SERTRALINE HCL 25 MG PO TABS
25.0000 mg | ORAL_TABLET | Freq: Every day | ORAL | 1 refills | Status: AC
  Filled 2024-06-23: qty 30, 30d supply, fill #0

## 2024-07-27 ENCOUNTER — Telehealth: Admitting: Nurse Practitioner

## 2024-07-27 DIAGNOSIS — B9689 Other specified bacterial agents as the cause of diseases classified elsewhere: Secondary | ICD-10-CM

## 2024-07-27 DIAGNOSIS — J019 Acute sinusitis, unspecified: Secondary | ICD-10-CM | POA: Diagnosis not present

## 2024-07-27 MED ORDER — IPRATROPIUM BROMIDE 0.03 % NA SOLN: 2.0000 | Freq: Two times a day (BID) | NASAL | 0 refills | Status: AC

## 2024-07-27 MED ORDER — AMOXICILLIN-POT CLAVULANATE 875-125 MG PO TABS: 1.0000 | ORAL_TABLET | Freq: Two times a day (BID) | ORAL | 0 refills | Status: AC

## 2024-07-27 NOTE — Progress Notes (Signed)
 E-Visit for Sinus Problems  We are sorry that you are not feeling well.  Here is how we plan to help!  Based on what you have shared with me it looks like you have sinusitis.  Sinusitis is inflammation and infection in the sinus cavities of the head.  Based on your presentation I believe you most likely have Acute Bacterial Sinusitis.  This is an infection caused by bacteria and is treated with antibiotics. I have prescribed Augmentin  875mg /125mg  one tablet twice daily with food, for 7 days. and I have also prescribed Ipratropium Bromide Nasal Spray Use 1 spray in each nostril twice daily as needed for drainage; discontinue if too drying You may use an oral decongestant such as Mucinex D or if you have glaucoma or high blood pressure use plain Mucinex. Saline nasal spray help and can safely be used as often as needed for congestion.  If you develop worsening sinus pain, fever or notice severe headache and vision changes, or if symptoms are not better after completion of antibiotic, please schedule an appointment with a health care provider.    Sinus infections are not as easily transmitted as other respiratory infection, however we still recommend that you avoid close contact with loved ones, especially the very young and elderly.  Remember to wash your hands thoroughly throughout the day as this is the number one way to prevent the spread of infection!  Home Care: Only take medications as instructed by your medical team. Complete the entire course of an antibiotic. Do not take these medications with alcohol. A steam or ultrasonic humidifier can help congestion.  You can place a towel over your head and breathe in the steam from hot water coming from a faucet. Avoid close contacts especially the very young and the elderly. Cover your mouth when you cough or sneeze. Always remember to wash your hands.  Get Help Right Away If: You develop worsening fever or sinus pain. You develop a severe head  ache or visual changes. Your symptoms persist after you have completed your treatment plan.  Make sure you Understand these instructions. Will watch your condition. Will get help right away if you are not doing well or get worse.  Your e-visit answers were reviewed by a board certified advanced clinical practitioner to complete your personal care plan.  Depending on the condition, your plan could have included both over the counter or prescription medications.  If there is a problem please reply  once you have received a response from your provider.  Your safety is important to us .  If you have drug allergies check your prescription carefully.    You can use MyChart to ask questions about today's visit, request a non-urgent call back, or ask for a work or school excuse for 24 hours related to this e-Visit. If it has been greater than 24 hours you will need to follow up with your provider, or enter a new e-Visit to address those concerns.  You will get an e-mail in the next two days asking about your experience.  I hope that your e-visit has been valuable and will speed your recovery. Thank you for using e-visits.  I have spent 5 minutes in review of e-visit questionnaire, review and updating patient chart, medical decision making and response to patient.   Deshaun Schou W Jonathan Kirkendoll, NP

## 2024-08-20 ENCOUNTER — Ambulatory Visit
Admission: EM | Admit: 2024-08-20 | Discharge: 2024-08-20 | Disposition: A | Discharge status: Home / Self Care | Source: Ambulatory Visit

## 2024-08-20 ENCOUNTER — Encounter: Payer: Self-pay | Admitting: Emergency Medicine

## 2024-08-20 DIAGNOSIS — J0140 Acute pansinusitis, unspecified: Secondary | ICD-10-CM | POA: Diagnosis not present

## 2024-08-20 MED ORDER — PREDNISONE 20 MG PO TABS: 40.0000 mg | ORAL_TABLET | Freq: Every day | ORAL | 0 refills | Status: AC

## 2024-08-20 MED ORDER — PROMETHAZINE-DM 6.25-15 MG/5ML PO SYRP: 10.0000 mL | ORAL_SOLUTION | Freq: Three times a day (TID) | ORAL | 0 refills | Status: AC | PRN

## 2024-08-20 MED ORDER — AZELASTINE HCL 0.1 % NA SOLN: 1.0000 | Freq: Two times a day (BID) | NASAL | 1 refills | Status: AC

## 2024-08-20 MED ORDER — CEFDINIR 250 MG/5ML PO SUSR: 300.0000 mg | Freq: Two times a day (BID) | ORAL | 0 refills | Status: AC

## 2024-08-20 NOTE — ED Provider Notes (Signed)
 " [facility]  Note:  This document was prepared using Dragon voice recognition software and may include unintentional dictation errors.  MRN: [id number] DOB: 24-Jan-1986  Subjective:   Debbie Harris is a 38 y.o. female presenting for dry cough, sinus drainage, sinus pressure, headache x 4 days.  Patient reports that off and on since Thanksgiving she has had viral upper respiratory infection with nasal drainage, congestion, cough, sore throat.  Patient has been using some over-the-counter meds with minimal improvement.  Patient reports that she is prone to sinusitis and states that earlier this year when she had a sinus infection she did not finish her antibiotics and was concerned that possibly this was the same infection just continued.  No shortness of breath, chest pain, weakness, dizziness, wheezing, fever.  Current Medications[1]   Allergies[2]  Past Medical History:  Diagnosis Date   Anxiety    [employer]ia 12/08/2020   Depression      Past Surgical History:  Procedure Laterality Date   BREAST ENHANCEMENT SURGERY Bilateral 2018   BREAST SURGERY N/A    [employer]reesia 12/08/2020   URETER SURGERY  1992    Family History  Problem Relation Age of Onset   Hypertension Father    Hyperlipidemia Father    Bladder Cancer Father    Breast cancer Maternal Grandmother     Social History[3]  ROS Refer to HPI for ROS details.  Objective:    Vitals: BP 126/82 (BP Location: Left Arm)   Pulse 66   Temp 98.1 F (36.7 C) (Oral)   Resp 18   Ht 5' 10 (1.778 m)   Wt 130 lb (59 kg)   LMP 07/30/2024 (Approximate)   SpO2 99%   BMI 18.65 kg/m   Physical Exam Vitals and nursing note reviewed.  Constitutional:      General: She is not in acute distress.    Appearance: She is well-developed. She is not ill-appearing or toxic-appearing.  HENT:     Head: Normocephalic and atraumatic.     Nose: Nasal tenderness, mucosal edema, congestion and rhinorrhea present.     Right  Sinus: Maxillary sinus tenderness and frontal sinus tenderness present.     Left Sinus: Maxillary sinus tenderness and frontal sinus tenderness present.     Mouth/Throat:     Mouth: Mucous membranes are moist.     Pharynx: Oropharynx is clear.  Eyes:     General:        Right eye: No discharge.        Left eye: No discharge.     Extraocular Movements: Extraocular movements intact.     Conjunctiva/sclera: Conjunctivae normal.  Cardiovascular:     Rate and Rhythm: Normal rate.  Pulmonary:     Effort: Pulmonary effort is normal. No respiratory distress.     Breath sounds: No stridor. No wheezing.  Skin:    General: Skin is warm and dry.  Neurological:     General: No focal deficit present.     Mental Status: She is alert and oriented to person, place, and time.  Psychiatric:        Mood and Affect: Mood normal.        Behavior: Behavior normal.     Procedures  No results found for this or any previous visit (from the past 24 hours).  Assessment and Plan :     Discharge Instructions       1. Acute non-recurrent pansinusitis (Primary) - cefdinir  (OMNICEF ) 250 MG/5ML suspension; Take 6 mLs (300  mg total) by mouth 2 (two) times daily for 7 days.  Dispense: 84 mL; Refill: 0 - predniSONE  (DELTASONE ) 20 MG tablet; Take 2 tablets (40 mg total) by mouth daily for 5 days.  Dispense: 10 tablet; Refill: 0 - azelastine (ASTELIN) 0.1 % nasal spray; Place 1 spray into both nostrils 2 (two) times daily. Use in each nostril as directed  Dispense: 30 mL; Refill: 1 - promethazine -dextromethorphan (PROMETHAZINE -DM) 6.25-15 MG/5ML syrup; Take 10 mLs by mouth 3 (three) times daily as needed.  Dispense: 240 mL; Refill: 0  -Continue to monitor symptoms for any change in severity if there is any escalation of current symptoms or development of new symptoms follow-up in ER for further evaluation and management.       Genene Kilman B Sharday Michl    [1] No current facility-administered medications for  this encounter.  Current Outpatient Medications:    azelastine (ASTELIN) 0.1 % nasal spray, Place 1 spray into both nostrils 2 (two) times daily. Use in each nostril as directed, Disp: 30 mL, Rfl: 1   cefdinir  (OMNICEF ) 250 MG/5ML suspension, Take 6 mLs (300 mg total) by mouth 2 (two) times daily for 7 days., Disp: 84 mL, Rfl: 0   predniSONE  (DELTASONE ) 20 MG tablet, Take 2 tablets (40 mg total) by mouth daily for 5 days., Disp: 10 tablet, Rfl: 0   promethazine -dextromethorphan (PROMETHAZINE -DM) 6.25-15 MG/5ML syrup, Take 10 mLs by mouth 3 (three) times daily as needed., Disp: 240 mL, Rfl: 0   escitalopram  (LEXAPRO ) 20 MG tablet, Take 1 tablet (20 mg total) by mouth daily., Disp: 90 tablet, Rfl: 3   influenza vac split trivalent PF (FLUZONE ) 0.5 ML injection, Inject 0.5 mLs into the muscle., Disp: 0.5 mL, Rfl: 0   ipratropium (ATROVENT ) 0.03 % nasal spray, Place 2 sprays into both nostrils every 12 (twelve) hours., Disp: 30 mL, Rfl: 0   sertraline  (ZOLOFT ) 25 MG tablet, Take 1 tablet (25 mg total) by mouth daily., Disp: 30 tablet, Rfl: 1 [2]  Allergies Allergen Reactions   Sulfa Antibiotics   [3]  Social History Tobacco Use   Smoking status: Never   Smokeless tobacco: Never  Vaping Use   Vaping status: Never Used  Substance Use Topics   Alcohol use: Not Currently    Comment: social   Drug use: No     Aurea Goodell B, NP 08/20/24 1722  "

## 2024-08-20 NOTE — Discharge Instructions (Signed)
" °  1. Acute non-recurrent pansinusitis (Primary) - cefdinir  (OMNICEF ) 250 MG/5ML suspension; Take 6 mLs (300 mg total) by mouth 2 (two) times daily for 7 days.  Dispense: 84 mL; Refill: 0 - predniSONE  (DELTASONE ) 20 MG tablet; Take 2 tablets (40 mg total) by mouth daily for 5 days.  Dispense: 10 tablet; Refill: 0 - azelastine (ASTELIN) 0.1 % nasal spray; Place 1 spray into both nostrils 2 (two) times daily. Use in each nostril as directed  Dispense: 30 mL; Refill: 1 - promethazine -dextromethorphan (PROMETHAZINE -DM) 6.25-15 MG/5ML syrup; Take 10 mLs by mouth 3 (three) times daily as needed.  Dispense: 240 mL; Refill: 0  -Continue to monitor symptoms for any change in severity if there is any escalation of current symptoms or development of new symptoms follow-up in ER for further evaluation and management. "

## 2024-08-20 NOTE — ED Triage Notes (Signed)
 Pt st's she has had nonproductive cough, sinus drainage, sinus pain and headache for approx 4 days  Has used OTC meds without relief

## 2024-09-18 ENCOUNTER — Other Ambulatory Visit (HOSPITAL_COMMUNITY): Payer: Self-pay

## 2024-09-18 MED ORDER — SERTRALINE HCL 25 MG PO TABS
25.0000 mg | ORAL_TABLET | Freq: Every day | ORAL | 1 refills | Status: AC
  Filled 2024-09-18: qty 90, 90d supply, fill #0

## 2024-09-18 MED ORDER — ESCITALOPRAM OXALATE 10 MG PO TABS
10.0000 mg | ORAL_TABLET | Freq: Every day | ORAL | 1 refills | Status: AC
  Filled 2024-09-18: qty 90, 90d supply, fill #0

## 2024-09-19 ENCOUNTER — Other Ambulatory Visit: Payer: Self-pay

## 2024-09-19 ENCOUNTER — Other Ambulatory Visit (HOSPITAL_COMMUNITY): Payer: Self-pay

## 2024-09-19 MED ORDER — SERTRALINE HCL 25 MG PO TABS
25.0000 mg | ORAL_TABLET | Freq: Every day | ORAL | 0 refills | Status: AC
  Filled 2024-09-19: qty 90, 90d supply, fill #0

## 2024-10-20 ENCOUNTER — Encounter: Admitting: Family Medicine
